# Patient Record
Sex: Female | Born: 1965 | Race: White | Hispanic: No | Marital: Single | Smoking: Current every day smoker
Health system: Southern US, Community
[De-identification: ages and names within clinical notes are randomized; demographics above are authoritative.]

## PROBLEM LIST (undated history)

## (undated) DIAGNOSIS — I10 Essential (primary) hypertension: Secondary | ICD-10-CM

## (undated) DIAGNOSIS — K219 Gastro-esophageal reflux disease without esophagitis: Secondary | ICD-10-CM

## (undated) DIAGNOSIS — B1920 Unspecified viral hepatitis C without hepatic coma: Secondary | ICD-10-CM

## (undated) HISTORY — DX: Gastro-esophageal reflux disease without esophagitis: K21.9

---

## 2004-07-01 ENCOUNTER — Emergency Department: Payer: Self-pay | Admitting: Emergency Medicine

## 2010-05-28 ENCOUNTER — Emergency Department: Payer: Self-pay

## 2011-09-07 ENCOUNTER — Ambulatory Visit: Payer: Self-pay | Admitting: Family Medicine

## 2011-09-29 ENCOUNTER — Ambulatory Visit: Payer: Self-pay | Admitting: Family Medicine

## 2012-04-05 ENCOUNTER — Ambulatory Visit: Payer: Self-pay | Admitting: Family Medicine

## 2014-05-30 DIAGNOSIS — R079 Chest pain, unspecified: Secondary | ICD-10-CM | POA: Insufficient documentation

## 2014-05-30 LAB — LIPID PANEL
CHOLESTEROL: 205 mg/dL — AB (ref 0–200)
HDL: 43 mg/dL (ref 35–70)
LDL CALC: 127 mg/dL
TRIGLYCERIDES: 174 mg/dL — AB (ref 40–160)

## 2014-05-30 LAB — BASIC METABOLIC PANEL
BUN: 10 mg/dL (ref 4–21)
Creatinine: 0.7 mg/dL (ref 0.5–1.1)
Potassium: 5 mmol/L (ref 3.4–5.3)
Sodium: 137 mmol/L (ref 137–147)

## 2014-05-30 LAB — CBC AND DIFFERENTIAL
HEMATOCRIT: 40 % (ref 36–46)
HEMOGLOBIN: 11.9 g/dL — AB (ref 12.0–16.0)
NEUTROS ABS: 5 /uL
PLATELETS: 298 10*3/uL (ref 150–399)
WBC: 8.7 10^3/mL

## 2014-05-30 LAB — HEPATIC FUNCTION PANEL
ALK PHOS: 67 U/L (ref 25–125)
ALT: 21 U/L (ref 7–35)
AST: 22 U/L (ref 13–35)
Bilirubin, Total: 0.2 mg/dL

## 2014-05-30 LAB — TSH: TSH: 1.76 u[IU]/mL (ref 0.41–5.90)

## 2014-06-05 ENCOUNTER — Ambulatory Visit
Admit: 2014-06-05 | Disposition: A | Discharge status: Home / Self Care | Payer: Self-pay | Attending: Internal Medicine | Admitting: Internal Medicine

## 2015-08-25 DIAGNOSIS — R0789 Other chest pain: Secondary | ICD-10-CM

## 2015-09-24 ENCOUNTER — Other Ambulatory Visit: Payer: Self-pay | Admitting: Family Medicine

## 2015-09-24 DIAGNOSIS — Z1239 Encounter for other screening for malignant neoplasm of breast: Secondary | ICD-10-CM

## 2015-10-12 ENCOUNTER — Encounter: Payer: Self-pay | Admitting: Emergency Medicine

## 2015-10-12 ENCOUNTER — Emergency Department
Admission: EM | Admit: 2015-10-12 | Discharge: 2015-10-12 | Disposition: A | Discharge status: Home / Self Care | Payer: BLUE CROSS/BLUE SHIELD | Attending: Emergency Medicine | Admitting: Emergency Medicine

## 2015-10-12 DIAGNOSIS — J0100 Acute maxillary sinusitis, unspecified: Secondary | ICD-10-CM | POA: Diagnosis not present

## 2015-10-12 DIAGNOSIS — R0981 Nasal congestion: Secondary | ICD-10-CM | POA: Diagnosis present

## 2015-10-12 DIAGNOSIS — K0889 Other specified disorders of teeth and supporting structures: Secondary | ICD-10-CM | POA: Diagnosis not present

## 2015-10-12 DIAGNOSIS — F17210 Nicotine dependence, cigarettes, uncomplicated: Secondary | ICD-10-CM | POA: Diagnosis not present

## 2015-10-12 MED ORDER — AMOXICILLIN 500 MG PO CAPS: 500.0000 mg | ORAL_CAPSULE | Freq: Three times a day (TID) | ORAL | 0 refills | Status: DC

## 2015-10-12 MED ORDER — NAPROXEN 500 MG PO TABS
500.0000 mg | ORAL_TABLET | Freq: Once | ORAL | Status: AC
  Administered 2015-10-12: 500 mg via ORAL
  Filled 2015-10-12: qty 1

## 2015-10-12 MED ORDER — FEXOFENADINE-PSEUDOEPHED ER 60-120 MG PO TB12: 1.0000 | ORAL_TABLET | Freq: Two times a day (BID) | ORAL | 0 refills | Status: AC

## 2015-10-12 MED ORDER — TRAMADOL HCL 50 MG PO TABS: 50.0000 mg | ORAL_TABLET | Freq: Four times a day (QID) | ORAL | 0 refills | Status: AC | PRN

## 2015-10-12 MED ORDER — LIDOCAINE VISCOUS 2 % MT SOLN
15.0000 mL | Freq: Once | OROMUCOSAL | Status: AC
  Administered 2015-10-12: 15 mL via OROMUCOSAL
  Filled 2015-10-12: qty 15

## 2015-10-12 MED ORDER — TRAMADOL HCL 50 MG PO TABS
50.0000 mg | ORAL_TABLET | Freq: Once | ORAL | Status: AC
  Administered 2015-10-12: 50 mg via ORAL
  Filled 2015-10-12: qty 1

## 2015-10-12 NOTE — Discharge Instructions (Signed)
Follow-up or firmness of dental clinics provided. OPTIONS FOR DENTAL FOLLOW UP CARE  [place] [facility] and [facility] [email or web address] [phone])  [facility] [phone])  [place] [phone])  [place] [phone])  [facility] [phone]) This clinic caters to the indigent population and is on a lottery system. Location: [employer] [facility], [place], [place], [place] [facility] Hours: Wednesdays from 6pm - 9pm, patients seen by a lottery system. For dates, call or go to [email or web address] Services: Cleanings, fillings and simple extractions. Payment Options: DENTAL WORK IS FREE OF CHARGE. Bring proof of income or support. Best way to get seen: Arrive at 5:15 pm - this is a lottery, NOT first come/first serve, so arriving earlier will not increase your chances of being seen.     [facility] [phone] Select option 1 for emergencies   Location: [facility], [place], [address] [place] Clinic Hours: No walk-ins accepted - call the day before to schedule an appointment. Check in times are 9:30 am and 1:30 pm. Services: Simple extractions, temporary fillings, pulpectomy/pulp debridement, uncomplicated abscess drainage. Payment Options: PAYMENT IS DUE AT THE TIME OF SERVICE.  Fee is usually $100-200, additional surgical procedures (e.g. abscess drainage) may be extra. Cash, checks, Visa/MasterCard accepted.  Can file Medicaid if patient is covered for dental - patient should call case worker to check. No discount for [facility] patients. Best way to get seen: MUST call the day before and get onto the schedule. Can usually be seen the next 1-2 days. No walk-ins accepted.      [place] [phone]   Location: [place], [place] [facility] Hours: M, W, Th, F 8am or 1:30pm, Tues 9a or 1:30 - first come/first served. Services: Simple extractions, temporary fillings, uncomplicated abscess drainage.  You do not need to be an [facility] resident. Payment Options: PAYMENT IS DUE AT THE TIME OF SERVICE. Dental insurance, otherwise sliding scale - bring proof of income or support. Depending on income and treatment needed, cost is usually $50-200. Best way to get seen: Arrive early as it is first come/first served.     [facility] [phone]   Location: [place] [facility] Hours: Mon-Thu 8a-5p Services: Most basic dental services including extractions and fillings. Payment Options: PAYMENT IS DUE AT THE TIME OF SERVICE. Sliding scale, up to 50% off - bring proof if income or support. Medicaid with dental option accepted. Best way to get seen: Call to schedule an appointment, can usually be seen within 2 weeks OR they will try to see walk-ins - show up at [place] or 2p (you may have to wait).     [facility] [place] RESIDENTS ONLY   Location: [facility], [place] [address] [place], [place] [address] Clinic Hours: By appointment only. Monday - Thursday 8am-5pm, Friday 8am-12pm Services: Cleanings, fillings, extractions. Payment Options: PAYMENT IS DUE AT THE TIME OF SERVICE. Cash, Visa or MasterCard. Sliding scale - $30 minimum per service. Best way to get seen: Come in to office, complete packet and make an appointment - need proof of income or support monies for each household member and proof of [facility] residence. Usually takes about a month to get in.     [facility]  [phone] °  °Location: °[address] [place] °Clinic Hours: Walk-in Urgent Care  Dental Services are offered Monday-Friday mornings only. °The numbers of emergencies accepted daily is limited to the number of °providers available. °Maximum 15 - Mondays, Wednesdays & Thursdays °Maximum 10 - Tuesdays & Fridays °Services: °You do not need to be a [place] resident to be seen for a dental emergency. °Emergencies are defined as pain, swelling, abnormal bleeding, or dental trauma. Walkins will receive x-rays if needed. °NOTE: Dental cleaning is not an emergency. °Payment Options: °PAYMENT IS DUE AT THE TIME OF SERVICE. °Minimum co-pay is $40.00 for uninsured patients. °Minimum co-pay is $3.00 for Medicaid with dental coverage. °Dental Insurance is accepted and must be presented at time of visit. °Medicare does not cover dental. °Forms of payment: Cash, credit card, checks. °Best way to get seen: °If not previously registered with the clinic, walk-in dental registration begins at 7:15 am and is on a first come/first serve basis. °If previously registered with the clinic, call to make an appointment. °  °  °[facility] °[phone] °[place] RESIDENTS ONLY °  °Location: °[address], [place], [place] °Clinic Hours: °Mon-Thu 10a-2p °Services: Extractions only! °Payment Options: °FREE (donations accepted) - bring proof of income or support °Best way to get seen: °Call and schedule an appointment OR come at 8am on the 1st Monday of every month (except for holidays) when it is first come/first served. °  °  °[facility] °[phone] °  °Location: °[address], [place] °Clinic Hours: °Friday mornings °Services, Payment Options, Best way to get seen: °Call for info ° °

## 2015-10-12 NOTE — ED Provider Notes (Signed)
[facility] Emergency Department Provider Note   ____________________________________________   None    (approximate)  I have reviewed the triage vital signs and the nursing notes.   HISTORY  Chief Complaint Nasal Congestion and Dental Pain    HPI Jasmine Roach is a 50 y.o. female patient complaining of nasal congestion and cough for 3 months. Patient states complaining is increases she came be running down at night. Patient also complaining of dental pain which started this morning. Patient is rating her dental pain is 8/10. No palliative measures taken for this complaint. Patient rates her congested as a 4/10. Patient does not relieve over-the-counter medications. Patient denies any fever chills or complaints. Patient states she had to decrease her cigarette smoking secondary to the congestion.   Past Medical History:  Diagnosis Date  . GERD (gastroesophageal reflux disease)     Patient Active Problem List   Diagnosis Date Noted  . Chest pain 05/30/2014    Past Surgical History:  Procedure Laterality Date  . CESAREAN SECTION      Prior to Admission medications   Medication Sig Start Date End Date Taking? Authorizing Provider  amoxicillin (AMOXIL) 500 MG capsule Take 1 capsule (500 mg total) by mouth 3 (three) times daily. 10/12/15   Sable Feil, PA-C  fexofenadine-pseudoephedrine (ALLEGRA-D) 60-120 MG 12 hr tablet Take 1 tablet by mouth 2 (two) times daily. 10/12/15   Sable Feil, PA-C  traMADol (ULTRAM) 50 MG tablet Take 1 tablet (50 mg total) by mouth every 6 (six) hours as needed. 10/12/15 10/11/16  Sable Feil, PA-C    Allergies Review of patient's allergies indicates no known allergies.  Family History  Problem Relation Age of Onset  . Broken bones Mother     Hip  . Cancer Father     Colon  . Hypertension Father   . Hyperlipidemia Father   . Thyroid disease Brother   . Thyroid disease Son     Social History Social  History  Substance Use Topics  . Smoking status: Current Every Day Smoker    Packs/day: 1.00    Types: Cigarettes  . Smokeless tobacco: Never Used  . Alcohol use 0.6 oz/week    1 Cans of beer per week     Comment: One beer once a week.     Review of Systems Constitutional: No fever/chills Eyes: No visual changes. ENT: No sore throat. Cardiovascular: Denies chest pain. Respiratory: Denies shortness of breath. Gastrointestinal: No abdominal pain.  No nausea, no vomiting.  No diarrhea.  No constipation. Genitourinary: Negative for dysuria. Musculoskeletal: Negative for back pain. Skin: Negative for rash. Neurological: Negative for headaches, focal weakness or numbness. Endocrine:Hypertension, hyperlipidemia and hypothyroidism. Patient patient was noncompliance of medication for these conditions. ____________________________________________   PHYSICAL EXAM:  VITAL SIGNS: ED Triage Vitals  Enc Vitals Group     BP 10/12/15 1710 139/86     Pulse Rate 10/12/15 1710 98     Resp 10/12/15 1710 20     Temp 10/12/15 1710 98.5 F (36.9 C)     Temp Source 10/12/15 1710 Oral     SpO2 10/12/15 1710 98 %     Weight 10/12/15 1710 163 lb (73.9 kg)     Height 10/12/15 1710 5\' 1"  (1.549 m)     Head Circumference --      Peak Flow --      Pain Score 10/12/15 1716 8     Pain Loc --  Pain Edu? --      Excl. in [place]? --     Constitutional: Alert and oriented. Well appearing and in no acute distress. Eyes: Conjunctivae are normal. PERRL. EOMI. Head: Atraumatic. Nose: Bilateral maxillary guarding edematous nasal turbinates. Mouth/Throat: Mucous membranes are moist.  Oropharynx non-erythematous.Fractured tooth #14 with edematous gingiva. Postnasal drainage. Neck: No stridor.  No cervical spine tenderness to palpation. Hematological/Lymphatic/Immunilogical: No cervical lymphadenopathy. Cardiovascular: Normal rate, regular rhythm. Grossly normal heart sounds.  Good peripheral  circulation. Respiratory: Normal respiratory effort.  No retractions. Lungs CTAB. Gastrointestinal: Soft and nontender. No distention. No abdominal bruits. No CVA tenderness. Musculoskeletal: No lower extremity tenderness nor edema.  No joint effusions. Neurologic:  Normal speech and language. No gross focal neurologic deficits are appreciated. No gait instability. Skin:  Skin is warm, dry and intact. No rash noted. Psychiatric: Mood and affect are normal. Speech and behavior are normal.  ____________________________________________   LABS (all labs ordered are listed, but only abnormal results are displayed)  Labs Reviewed - No data to display ____________________________________________  EKG   ____________________________________________  RADIOLOGY   ____________________________________________   PROCEDURES  Procedure(s) performed: None  Procedures  Critical Care performed: No  ____________________________________________   INITIAL IMPRESSION / ASSESSMENT AND PLAN / ED COURSE  Pertinent labs & imaging results that were available during my care of the patient were reviewed by me and considered in my medical decision making (see chart for details).  Sinusitis and dental pain. Patient given discharge Instructions. Patient advised to follow the walk-in dental clinic in 2 days. Patient [facility]. Patient given a prescription for amoxicillin, tramadol, and ibuprofen.  Clinical Course     ____________________________________________   FINAL CLINICAL IMPRESSION(S) / ED DIAGNOSES  Final diagnoses:  Acute maxillary sinusitis, recurrence not specified  Pain, dental      NEW MEDICATIONS STARTED DURING THIS VISIT:  New Prescriptions   AMOXICILLIN (AMOXIL) 500 MG CAPSULE    Take 1 capsule (500 mg total) by mouth 3 (three) times daily.   FEXOFENADINE-PSEUDOEPHEDRINE (ALLEGRA-D) 60-120 MG 12 HR TABLET    Take 1  tablet by mouth 2 (two) times daily.   TRAMADOL (ULTRAM) 50 MG TABLET    Take 1 tablet (50 mg total) by mouth every 6 (six) hours as needed.     Note:  This document was prepared using Dragon voice recognition software and may include unintentional dictation errors.    Sable Feil, PA-C 10/12/15 1750    Delman Kitten, MD 10/12/15 2337

## 2015-10-12 NOTE — ED Triage Notes (Signed)
Pt arrived to ED with complaints of congestion that has been ongoing for 3 mos. Pt states she has congestion and "can't breath". Pt also states she has had a nonproductive cough and woke this morning with dental pain on the right upper side.

## 2015-10-14 ENCOUNTER — Ambulatory Visit: Payer: Self-pay

## 2015-11-06 ENCOUNTER — Other Ambulatory Visit: Payer: Self-pay | Admitting: Family Medicine

## 2015-11-06 ENCOUNTER — Ambulatory Visit
Admission: RE | Admit: 2015-11-06 | Discharge: 2015-11-06 | Disposition: A | Discharge status: Home / Self Care | Payer: BLUE CROSS/BLUE SHIELD | Source: Ambulatory Visit | Attending: Family Medicine | Admitting: Family Medicine

## 2015-11-06 DIAGNOSIS — Z1231 Encounter for screening mammogram for malignant neoplasm of breast: Secondary | ICD-10-CM | POA: Diagnosis not present

## 2015-11-06 DIAGNOSIS — Z1239 Encounter for other screening for malignant neoplasm of breast: Secondary | ICD-10-CM | POA: Insufficient documentation

## 2017-01-12 ENCOUNTER — Emergency Department: Payer: No Typology Code available for payment source

## 2017-01-12 ENCOUNTER — Encounter: Payer: Self-pay | Admitting: Emergency Medicine

## 2017-01-12 ENCOUNTER — Emergency Department
Admission: EM | Admit: 2017-01-12 | Discharge: 2017-01-12 | Disposition: A | Discharge status: Home / Self Care | Payer: No Typology Code available for payment source | Attending: Emergency Medicine | Admitting: Emergency Medicine

## 2017-01-12 DIAGNOSIS — R0789 Other chest pain: Secondary | ICD-10-CM | POA: Insufficient documentation

## 2017-01-12 DIAGNOSIS — Z760 Encounter for issue of repeat prescription: Secondary | ICD-10-CM | POA: Insufficient documentation

## 2017-01-12 DIAGNOSIS — I10 Essential (primary) hypertension: Secondary | ICD-10-CM | POA: Diagnosis not present

## 2017-01-12 DIAGNOSIS — R079 Chest pain, unspecified: Secondary | ICD-10-CM | POA: Diagnosis present

## 2017-01-12 DIAGNOSIS — F17210 Nicotine dependence, cigarettes, uncomplicated: Secondary | ICD-10-CM | POA: Insufficient documentation

## 2017-01-12 HISTORY — DX: Essential (primary) hypertension: I10

## 2017-01-12 LAB — CBC
HCT: 39 % (ref 35.0–47.0)
Hemoglobin: 12.5 g/dL (ref 12.0–16.0)
MCH: 25.5 pg — ABNORMAL LOW (ref 26.0–34.0)
MCHC: 32 g/dL (ref 32.0–36.0)
MCV: 79.8 fL — AB (ref 80.0–100.0)
PLATELETS: 248 10*3/uL (ref 150–440)
RBC: 4.89 MIL/uL (ref 3.80–5.20)
RDW: 16.4 % — AB (ref 11.5–14.5)
WBC: 7.2 10*3/uL (ref 3.6–11.0)

## 2017-01-12 LAB — BASIC METABOLIC PANEL
Anion gap: 8 (ref 5–15)
BUN: 8 mg/dL (ref 6–20)
CALCIUM: 8.9 mg/dL (ref 8.9–10.3)
CO2: 26 mmol/L (ref 22–32)
CREATININE: 0.67 mg/dL (ref 0.44–1.00)
Chloride: 104 mmol/L (ref 101–111)
GFR calc Af Amer: >60 mL/min (ref >60)
GLUCOSE: 128 mg/dL — AB (ref 65–99)
Potassium: 3.9 mmol/L (ref 3.5–5.1)
Sodium: 138 mmol/L (ref 135–145)

## 2017-01-12 LAB — TROPONIN I: Troponin I: <0.03 ng/mL (ref <0.03)

## 2017-01-12 MED ORDER — LISINOPRIL 10 MG PO TABS: 10.0000 mg | ORAL_TABLET | Freq: Every day | ORAL | 0 refills | Status: AC

## 2017-01-12 MED ORDER — ATORVASTATIN CALCIUM 40 MG PO TABS: 40.0000 mg | ORAL_TABLET | Freq: Every day | ORAL | 11 refills | Status: AC

## 2017-01-12 NOTE — ED Triage Notes (Signed)
PT to ED via POV with c/o CP radiating to LFT arm at work this am, was seen by paramedics and told to come to rule out MI. PT A&Ox4, NAD noted. VS stable

## 2017-01-12 NOTE — ED Notes (Signed)
Gave patient ice chips.

## 2017-01-12 NOTE — ED Provider Notes (Signed)
[facility] Emergency Department Provider Note  ____________________________________________  Time seen: Approximately 11:38 AM  I have reviewed the triage vital signs and the nursing notes.   HISTORY  Chief Complaint Chest Pain    HPI Jasmine Roach is a 51 y.o. female comes to the ED complaining of intermittent chest pain going on for the last 3 days, worse with moving her arm up above her head. No alleviating factors. Last for a few minutes at a time. Nonradiating. No shortness of breath vomiting or diaphoresis due to the pain. Nonradiating. Feels sharp. Onset after moving a refrigerator couple of days ago.     Past Medical History:  Diagnosis Date  . GERD (gastroesophageal reflux disease)   . Hypertension      Patient Active Problem List   Diagnosis Date Noted  . Chest pain 05/30/2014     Past Surgical History:  Procedure Laterality Date  . CESAREAN SECTION       Prior to Admission medications   Medication Sig Start Date End Date Taking? Authorizing Provider  amoxicillin (AMOXIL) 500 MG capsule Take 1 capsule (500 mg total) by mouth 3 (three) times daily. 10/12/15   Sable Feil, PA-C  atorvastatin (LIPITOR) 40 MG tablet Take 1 tablet (40 mg total) by mouth daily. 01/12/17 01/12/18  Carrie Mew, MD  fexofenadine-pseudoephedrine (ALLEGRA-D) 60-120 MG 12 hr tablet Take 1 tablet by mouth 2 (two) times daily. 10/12/15   Sable Feil, PA-C  lisinopril (PRINIVIL,ZESTRIL) 10 MG tablet Take 1 tablet (10 mg total) by mouth daily. 01/12/17 01/12/18  Carrie Mew, MD     Allergies Patient has no known allergies.   Family History  Problem Relation Age of Onset  . Broken bones Mother        Hip  . Cancer Father        Colon  . Hypertension Father   . Hyperlipidemia Father   . Thyroid disease Brother   . Thyroid disease Son   . Breast cancer Other     Social History Social History   Tobacco Use  . Smoking status: Current  Every Day Smoker    Packs/day: 1.00    Types: Cigarettes  . Smokeless tobacco: Never Used  Substance Use Topics  . Alcohol use: Yes    Alcohol/week: 0.6 oz    Types: 1 Cans of beer per week    Comment: One beer once a week.   . Drug use: No    Review of Systems  Constitutional:   No fever or chills.  ENT:   No sore throat. No rhinorrhea. Cardiovascular:   Positive as above chest pain without syncope. Respiratory:   No dyspnea or cough. Gastrointestinal:   Negative for abdominal pain, vomiting and diarrhea.  Musculoskeletal:   Positive left shoulder pain All other systems reviewed and are negative except as documented above in ROS and HPI.  ____________________________________________   PHYSICAL EXAM:  VITAL SIGNS: ED Triage Vitals  Enc Vitals Group     BP 01/12/17 0911 (!) 149/82     Pulse Rate 01/12/17 0911 74     Resp 01/12/17 0911 16     Temp 01/12/17 0911 97.7 F (36.5 C)     Temp Source 01/12/17 0911 Oral     SpO2 01/12/17 0911 100 %     Weight 01/12/17 0912 160 lb (72.6 kg)     Height 01/12/17 0912 5\' 1"  (1.549 m)     Head Circumference --  Peak Flow --      Pain Score 01/12/17 0911 5     Pain Loc --      Pain Edu? --      Excl. in [place]? --     Vital signs reviewed, nursing assessments reviewed.   Constitutional:   Alert and oriented. Well appearing and in no distress. Eyes:   No scleral icterus.  EOMI. No nystagmus. No conjunctival pallor. PERRL. ENT   Head:   Normocephalic and atraumatic.   Nose:   No congestion/rhinnorhea.    Mouth/Throat:   MMM, no pharyngeal erythema. No peritonsillar mass.    Neck:   No meningismus. Full ROM. Hematological/Lymphatic/Immunilogical:   No cervical lymphadenopathy. Cardiovascular:   RRR. Symmetric bilateral radial and DP pulses.  No murmurs.  Respiratory:   Normal respiratory effort without tachypnea/retractions. Breath sounds are clear and equal bilaterally. No wheezes/rales/rhonchi. Gastrointestinal:    Soft and nontender. Non distended. There is no CVA tenderness.  No rebound, rigidity, or guarding. Genitourinary:   deferred Musculoskeletal:   Normal range of motion in all extremities. No joint effusions.  No lower extremity tenderness.  No edema. Chest wall tender to the touch in the left anterior upper chest. Pain is reproduced by this. Also reproduced by having the patient adduct and internally rotate the left arm against resistance. Neurologic:   Normal speech and language.  Motor grossly intact. No gross focal neurologic deficits are appreciated.  Skin:    Skin is warm, dry and intact. No rash noted.  No petechiae, purpura, or bullae.  ____________________________________________    LABS (pertinent positives/negatives) (all labs ordered are listed, but only abnormal results are displayed) Labs Reviewed  BASIC METABOLIC PANEL - Abnormal; Notable for the following components:      Result Value   Glucose, Bld 128 (*)    All other components within normal limits  CBC - Abnormal; Notable for the following components:   MCV 79.8 (*)    MCH 25.5 (*)    RDW 16.4 (*)    All other components within normal limits  TROPONIN I  POC URINE PREG, ED   ____________________________________________   EKG  Interpreted by me  Date: 01/12/2017  Rate: 79  Rhythm: normal sinus rhythm  QRS Axis: normal  Intervals: normal  ST/T Wave abnormalities: normal  Conduction Disutrbances: none  Narrative Interpretation: unremarkable      ____________________________________________    RADIOLOGY  Dg Chest 2 View  Result Date: 01/12/2017 CLINICAL DATA:  Shortness of breath and chest pain. Smoking history. EXAM: CHEST  2 VIEW COMPARISON:  05/28/2010 FINDINGS: Heart size is normal. Mediastinal shadows are normal. The patient has taken a poor inspiration. Allowing for that, the lungs are felt to be clear. No infiltrate, collapse or effusion. No significant bone finding. IMPRESSION: Poor  inspiration.  No active disease suspected. Electronically Signed   By: Nelson Chimes M.D.   On: 01/12/2017 09:50    ____________________________________________   PROCEDURES Procedures  ____________________________________________     CLINICAL IMPRESSION / ASSESSMENT AND PLAN / ED COURSE  Pertinent labs & imaging results that were available during my care of the patient were reviewed by me and considered in my medical decision making (see chart for details).   Patient well-appearing no acute distress, presents with musculoskeletal chest wall pain likely muscular strain.Considering the patient's symptoms, medical history, and physical examination today, I have low suspicion for ACS, PE, TAD, pneumothorax, carditis, mediastinitis, pneumonia, CHF, or sepsis.  Heart score is low risk. Vital  signs are normal, EKG is normal, troponin is negative. Discharge home. Patient also requests a refill of her atorvastatin and lisinopril which I have provided for her. Provided resources for primary care clinics in the area as well.      ____________________________________________   FINAL CLINICAL IMPRESSION(S) / ED DIAGNOSES    Final diagnoses:  Chest wall pain  Medication refill      This SmartLink is deprecated. Use AVSMEDLIST instead to display the medication list for a patient.   Portions of this note were generated with dragon dictation software. Dictation errors may occur despite best attempts at proofreading.    Carrie Mew, MD 01/12/17 [phone]

## 2018-01-16 ENCOUNTER — Ambulatory Visit: Payer: No Typology Code available for payment source

## 2018-03-09 ENCOUNTER — Other Ambulatory Visit: Payer: Self-pay | Admitting: Family Medicine

## 2018-03-09 ENCOUNTER — Other Ambulatory Visit: Payer: Self-pay | Admitting: Psychology

## 2018-03-09 ENCOUNTER — Other Ambulatory Visit (HOSPITAL_COMMUNITY): Payer: Self-pay | Admitting: Family Medicine

## 2018-03-09 DIAGNOSIS — N950 Postmenopausal bleeding: Secondary | ICD-10-CM

## 2018-03-09 DIAGNOSIS — B1920 Unspecified viral hepatitis C without hepatic coma: Secondary | ICD-10-CM

## 2018-03-14 ENCOUNTER — Ambulatory Visit
Admission: RE | Admit: 2018-03-14 | Discharge: 2018-03-14 | Disposition: A | Discharge status: Home / Self Care | Payer: BLUE CROSS/BLUE SHIELD | Source: Ambulatory Visit | Attending: Family Medicine | Admitting: Family Medicine

## 2018-03-14 DIAGNOSIS — N950 Postmenopausal bleeding: Secondary | ICD-10-CM | POA: Insufficient documentation

## 2018-03-14 DIAGNOSIS — B1920 Unspecified viral hepatitis C without hepatic coma: Secondary | ICD-10-CM

## 2018-04-02 ENCOUNTER — Emergency Department
Admission: EM | Admit: 2018-04-02 | Discharge: 2018-04-02 | Disposition: A | Discharge status: Home / Self Care | Payer: BLUE CROSS/BLUE SHIELD | Attending: Emergency Medicine | Admitting: Emergency Medicine

## 2018-04-02 ENCOUNTER — Other Ambulatory Visit: Payer: Self-pay

## 2018-04-02 ENCOUNTER — Emergency Department: Payer: BLUE CROSS/BLUE SHIELD

## 2018-04-02 DIAGNOSIS — Z79899 Other long term (current) drug therapy: Secondary | ICD-10-CM | POA: Diagnosis not present

## 2018-04-02 DIAGNOSIS — R1011 Right upper quadrant pain: Secondary | ICD-10-CM | POA: Diagnosis present

## 2018-04-02 DIAGNOSIS — I10 Essential (primary) hypertension: Secondary | ICD-10-CM | POA: Insufficient documentation

## 2018-04-02 DIAGNOSIS — N390 Urinary tract infection, site not specified: Secondary | ICD-10-CM

## 2018-04-02 DIAGNOSIS — F17210 Nicotine dependence, cigarettes, uncomplicated: Secondary | ICD-10-CM | POA: Diagnosis not present

## 2018-04-02 HISTORY — DX: Unspecified viral hepatitis C without hepatic coma: B19.20

## 2018-04-02 LAB — COMPREHENSIVE METABOLIC PANEL
ALBUMIN: 3.5 g/dL (ref 3.5–5.0)
ALK PHOS: 77 U/L (ref 38–126)
ALT: 11 U/L (ref 0–44)
ANION GAP: 8 (ref 5–15)
AST: 21 U/L (ref 15–41)
BILIRUBIN TOTAL: 0.4 mg/dL (ref 0.3–1.2)
BUN: 9 mg/dL (ref 6–20)
CO2: 25 mmol/L (ref 22–32)
Calcium: 8.6 mg/dL — ABNORMAL LOW (ref 8.9–10.3)
Chloride: 105 mmol/L (ref 98–111)
Creatinine, Ser: 0.69 mg/dL (ref 0.44–1.00)
Glucose, Bld: 163 mg/dL — ABNORMAL HIGH (ref 70–99)
POTASSIUM: 3.7 mmol/L (ref 3.5–5.1)
Sodium: 138 mmol/L (ref 135–145)
Total Protein: 7.5 g/dL (ref 6.5–8.1)

## 2018-04-02 LAB — CBC
HCT: 39.9 % (ref 36.0–46.0)
HEMOGLOBIN: 12.6 g/dL (ref 12.0–15.0)
MCH: 26.4 pg (ref 26.0–34.0)
MCHC: 31.6 g/dL (ref 30.0–36.0)
MCV: 83.5 fL (ref 80.0–100.0)
NRBC: 0 % (ref 0.0–0.2)
Platelets: 315 10*3/uL (ref 150–400)
RBC: 4.78 MIL/uL (ref 3.87–5.11)
RDW: 13.3 % (ref 11.5–15.5)
WBC: 8.3 10*3/uL (ref 4.0–10.5)

## 2018-04-02 LAB — URINALYSIS, COMPLETE (UACMP) WITH MICROSCOPIC
BILIRUBIN URINE: NEGATIVE
Bacteria, UA: NONE SEEN
GLUCOSE, UA: NEGATIVE mg/dL
Ketones, ur: NEGATIVE mg/dL
Nitrite: NEGATIVE
PH: 5 (ref 5.0–8.0)
Protein, ur: 30 mg/dL — AB
Specific Gravity, Urine: 1.014 (ref 1.005–1.030)

## 2018-04-02 LAB — LIPASE, BLOOD: Lipase: 60 U/L — ABNORMAL HIGH (ref 11–51)

## 2018-04-02 MED ORDER — IOHEXOL 300 MG/ML  SOLN
100.0000 mL | Freq: Once | INTRAMUSCULAR | Status: AC | PRN
  Administered 2018-04-02: 100 mL via INTRAVENOUS

## 2018-04-02 MED ORDER — SODIUM CHLORIDE 0.9 % IV SOLN
1.0000 g | Freq: Once | INTRAVENOUS | Status: AC
  Administered 2018-04-02: 1 g via INTRAVENOUS
  Filled 2018-04-02: qty 10

## 2018-04-02 MED ORDER — IOPAMIDOL (ISOVUE-300) INJECTION 61%
30.0000 mL | Freq: Once | INTRAVENOUS | Status: AC
  Administered 2018-04-02: 30 mL via ORAL

## 2018-04-02 MED ORDER — AMOXICILLIN-POT CLAVULANATE 875-125 MG PO TABS: 1.0000 | ORAL_TABLET | Freq: Two times a day (BID) | ORAL | 0 refills | Status: AC

## 2018-04-02 MED ORDER — HYDROCODONE-ACETAMINOPHEN 5-325 MG PO TABS: 1.0000 | ORAL_TABLET | Freq: Four times a day (QID) | ORAL | 0 refills | Status: AC | PRN

## 2018-04-02 MED ORDER — ONDANSETRON HCL 4 MG/2ML IJ SOLN
4.0000 mg | Freq: Once | INTRAMUSCULAR | Status: AC
  Administered 2018-04-02: 4 mg via INTRAVENOUS
  Filled 2018-04-02: qty 2

## 2018-04-02 NOTE — ED Triage Notes (Addendum)
Pt points to R flank area. Pt points to RUQ. Family member states RLQ has been hurting pt. Denies BM recently. Symptoms x 3 days. Still has abd organs. Also c/o dysuria. Pt states still in menopause. Family member attempting to talk over pt when pt is able to answer questions herself. States seen last week and had pelvic US and [place] of liver.

## 2018-04-02 NOTE — ED Provider Notes (Signed)
[facility] Emergency Department Provider Note   ____________________________________________   First MD Initiated Contact with Patient 04/02/18 1641     (approximate)  I have reviewed the triage vital signs and the nursing notes.   HISTORY  Chief Complaint Abdominal Pain    HPI Jasmine Roach is a 53 y.o. female reports of a history of hepatitis C that was recently treated and "cured" about 2 weeks ago.  Still having about 3 to 4 days of pain in her right flank area.  She reports is fairly persistent.  Slightly worse with urination.  No nausea.  No vomiting.  No fevers or chills.  She reports this pain in the right flank some worsening with urination.  Denies severe pain.  Reports mild to moderate.  It is a consistent pain however.  No chest pain.  No trouble breathing.  No ripping or tearing discomfort   Past Medical History:  Diagnosis Date  . GERD (gastroesophageal reflux disease)   . Hepatitis C   . Hypertension     Patient Active Problem List   Diagnosis Date Noted  . Chest pain 05/30/2014    Past Surgical History:  Procedure Laterality Date  . CESAREAN SECTION      Prior to Admission medications   Medication Sig Start Date End Date Taking? Authorizing Provider  amoxicillin (AMOXIL) 500 MG capsule Take 1 capsule (500 mg total) by mouth 3 (three) times daily. 10/12/15   Sable Feil, PA-C  amoxicillin-clavulanate (AUGMENTIN) 875-125 MG tablet Take 1 tablet by mouth 2 (two) times daily for 7 days. 04/02/18 04/09/18  Delman Kitten, MD  atorvastatin (LIPITOR) 40 MG tablet Take 1 tablet (40 mg total) by mouth daily. 01/12/17 01/12/18  Carrie Mew, MD  fexofenadine-pseudoephedrine (ALLEGRA-D) 60-120 MG 12 hr tablet Take 1 tablet by mouth 2 (two) times daily. 10/12/15   Sable Feil, PA-C  HYDROcodone-acetaminophen (NORCO/VICODIN) 5-325 MG tablet Take 1 tablet by mouth every 6 (six) hours as needed for moderate pain. 04/02/18   Delman Kitten, MD    lisinopril (PRINIVIL,ZESTRIL) 10 MG tablet Take 1 tablet (10 mg total) by mouth daily. 01/12/17 01/12/18  Carrie Mew, MD    Allergies Patient has no known allergies.  Family History  Problem Relation Age of Onset  . Broken bones Mother        Hip  . Cancer Father        Colon  . Hypertension Father   . Hyperlipidemia Father   . Thyroid disease Brother   . Thyroid disease Son   . Breast cancer Other     Social History Social History   Tobacco Use  . Smoking status: Current Every Day Smoker    Packs/day: 1.00    Types: Cigarettes  . Smokeless tobacco: Never Used  Substance Use Topics  . Alcohol use: Yes    Alcohol/week: 1.0 standard drinks    Types: 1 Cans of beer per week    Comment: One beer once a week.   . Drug use: No    Review of Systems Constitutional: No fever/chills Eyes: No visual changes. ENT: No sore throat. Cardiovascular: Denies chest pain. Respiratory: Denies shortness of breath. Gastrointestinal: N see HPI Genitourinary: Some discomfort with urination Musculoskeletal: Negative for back pain. Skin: Negative for rash. Neurological: Negative for headaches, areas of focal weakness or numbness.    ____________________________________________   PHYSICAL EXAM:  VITAL SIGNS: ED Triage Vitals  Enc Vitals Group     BP 04/02/18 1334 (!)  149/83     Pulse Rate 04/02/18 1334 87     Resp 04/02/18 1334 18     Temp 04/02/18 1334 (!) 97.5 F (36.4 C)     Temp Source 04/02/18 1334 Oral     SpO2 04/02/18 1334 98 %     Weight 04/02/18 1335 178 lb (80.7 kg)     Height 04/02/18 1335 5\' 1"  (1.549 m)     Head Circumference --      Peak Flow --      Pain Score 04/02/18 1334 5     Pain Loc --      Pain Edu? --      Excl. in [place]? --     Constitutional: Alert and oriented. Well appearing and in no acute distress.  She and her family at the bedside very pleasant. Eyes: Conjunctivae are normal. Head: Atraumatic. Nose: No  congestion/rhinnorhea. Mouth/Throat: Mucous membranes are moist. Neck: No stridor.  Cardiovascular: Normal rate, regular rhythm. Grossly normal heart sounds.  Good peripheral circulation. Respiratory: Normal respiratory effort.  No retractions. Lungs CTAB. Gastrointestinal: Soft and nontender except she does report some mild discomfort in the right flank right upper quadrant region without rebound or guarding. No distention.  No CVA tenderness. Musculoskeletal: No lower extremity tenderness nor edema. Neurologic:  Normal speech and language. No gross focal neurologic deficits are appreciated.  Skin:  Skin is warm, dry and intact. No rash noted. Psychiatric: Mood and affect are normal. Speech and behavior are normal.  ____________________________________________   LABS (all labs ordered are listed, but only abnormal results are displayed)  Labs Reviewed  URINE CULTURE - Abnormal; Notable for the following components:      Result Value   Culture >=100,000 COLONIES/mL ESCHERICHIA COLI (*)    Organism ID, Bacteria ESCHERICHIA COLI (*)    All other components within normal limits  LIPASE, BLOOD - Abnormal; Notable for the following components:   Lipase 60 (*)    All other components within normal limits  COMPREHENSIVE METABOLIC PANEL - Abnormal; Notable for the following components:   Glucose, Bld 163 (*)    Calcium 8.6 (*)    All other components within normal limits  URINALYSIS, COMPLETE (UACMP) WITH MICROSCOPIC - Abnormal; Notable for the following components:   Color, Urine YELLOW (*)    APPearance HAZY (*)    Hgb urine dipstick SMALL (*)    Protein, ur 30 (*)    Leukocytes,Ua TRACE (*)    WBC, UA >50 (*)    All other components within normal limits  CBC   ____________________________________________  EKG   ____________________________________________  RADIOLOGY   Reviewed ordered radiography studies  IMPRESSION: CT scan No acute abnormality abdomen or pelvis. No  finding to explain the patient's symptoms.   ____________________________________________   PROCEDURES  Procedure(s) performed: None  Procedures  Critical Care performed: No  ____________________________________________   INITIAL IMPRESSION / ASSESSMENT AND PLAN / ED COURSE  Pertinent labs & imaging results that were available during my care of the patient were reviewed by me and considered in my medical decision making (see chart for details).   Differential diagnosis includes but is not limited to, abdominal perforation, aortic dissection, cholecystitis, appendicitis, diverticulitis, colitis, esophagitis/gastritis, kidney stone, pyelonephritis, urinary tract infection, aortic aneurysm. All are considered in decision and treatment plan. Based upon the patient's presentation and risk factors, I suspect this may be urinary in etiology but she does have some discomfort in the right flank.  Overall her lab work including  comprehensive metabolic panel and CBC are quite reassuring.  Proceed with CT abdomen pelvis to further evaluate given the nature located in the right flank, did not wish to miss appendicitis or cholecystitis though I suspect this is likely urinary source based on urinalysis  Patient CT scan reassuring, she is doing well and in no distress.  Discussed with the patient, does appear consistent with urinary tract infection.  No signs of pyelonephritis at this time.  Will treat with antibiotics and a short course of pain control medication. I will prescribe the patient a narcotic pain medicine due to their condition which I anticipate will cause at least moderate pain short term. I discussed with the patient safe use of narcotic pain medicines, and that they are not to drive, work in dangerous areas, or ever take more than prescribed (no more than 1 pill every 6 hours). We discussed that this is the type of medication that can be  overdosed on and the risks of this type of medicine.  Patient is very agreeable to only use as prescribed and to never use more than prescribed.  The patient is not driving herself.  Return precautions and treatment recommendations and follow-up discussed with the patient who is agreeable with the plan.        ____________________________________________   FINAL CLINICAL IMPRESSION(S) / ED DIAGNOSES  Final diagnoses:  Urinary tract infection, acute        Note:  This document was prepared using Dragon voice recognition software and may include unintentional dictation errors       Delman Kitten, MD 04/08/18 2134

## 2018-04-02 NOTE — ED Notes (Signed)
[facility] sent to lobby by Threasa Beards EDT

## 2018-04-02 NOTE — Discharge Instructions (Addendum)

## 2018-04-02 NOTE — ED Provider Notes (Signed)
[facility] Emergency Department Provider Note  ____________________________________________   First MD Initiated Contact with Patient 04/02/18 1641     (approximate)  I have reviewed the triage vital signs and the nursing notes.  HISTORY  Chief Complaint Abdominal Pain    HPI LEVERNE TESSLER is a 53 y.o. female history of reflux, hypertension and recently treated and she reports cured from hepatitis C about 2 weeks ago   She reports that for about 3 days now she has discomfort in her right flank area.  She is also been noticing a burning feeling with urination.  A little bit of nausea but no vomiting.  No fevers or chills.  No chest pain or trouble breathing  Reports the pain is moderate located mostly in the right flank region not associated any vomiting.  Past Medical History:  Diagnosis Date  . GERD (gastroesophageal reflux disease)   . Hepatitis C   . Hypertension     Patient Active Problem List   Diagnosis Date Noted  . Chest pain 05/30/2014    Past Surgical History:  Procedure Laterality Date  . CESAREAN SECTION      Prior to Admission medications   Medication Sig Start Date End Date Taking? Authorizing Provider  amoxicillin (AMOXIL) 500 MG capsule Take 1 capsule (500 mg total) by mouth 3 (three) times daily. 10/12/15   Sable Feil, PA-C  amoxicillin-clavulanate (AUGMENTIN) 875-125 MG tablet Take 1 tablet by mouth 2 (two) times daily for 7 days. 04/02/18 04/09/18  Delman Kitten, MD  atorvastatin (LIPITOR) 40 MG tablet Take 1 tablet (40 mg total) by mouth daily. 01/12/17 01/12/18  Carrie Mew, MD  fexofenadine-pseudoephedrine (ALLEGRA-D) 60-120 MG 12 hr tablet Take 1 tablet by mouth 2 (two) times daily. 10/12/15   Sable Feil, PA-C  HYDROcodone-acetaminophen (NORCO/VICODIN) 5-325 MG tablet Take 1 tablet by mouth every 6 (six) hours as needed for moderate pain. 04/02/18   Delman Kitten, MD  lisinopril (PRINIVIL,ZESTRIL) 10 MG tablet Take  1 tablet (10 mg total) by mouth daily. 01/12/17 01/12/18  Carrie Mew, MD    Allergies Patient has no known allergies.  Family History  Problem Relation Age of Onset  . Broken bones Mother        Hip  . Cancer Father        Colon  . Hypertension Father   . Hyperlipidemia Father   . Thyroid disease Brother   . Thyroid disease Son   . Breast cancer Other     Social History Social History   Tobacco Use  . Smoking status: Current Every Day Smoker    Packs/day: 1.00    Types: Cigarettes  . Smokeless tobacco: Never Used  Substance Use Topics  . Alcohol use: Yes    Alcohol/week: 1.0 standard drinks    Types: 1 Cans of beer per week    Comment: One beer once a week.   . Drug use: No    Review of Systems Constitutional: No fever/chills Eyes: No visual changes. ENT: No sore throat. Cardiovascular: Denies chest pain. Respiratory: Denies shortness of breath. Gastrointestinal: No diarrhea.  See HPI genitourinary: Negative for dysuria. Musculoskeletal: Negative for back pain. Skin: Negative for rash. Neurological: Negative for headaches, areas of focal weakness or numbness.    ____________________________________________   PHYSICAL EXAM:  VITAL SIGNS: ED Triage Vitals  Enc Vitals Group     BP 04/02/18 1334 (!) 149/83     Pulse Rate 04/02/18 1334 87  Resp 04/02/18 1334 18     Temp 04/02/18 1334 (!) 97.5 F (36.4 C)     Temp Source 04/02/18 1334 Oral     SpO2 04/02/18 1334 98 %     Weight 04/02/18 1335 178 lb (80.7 kg)     Height 04/02/18 1335 5\' 1"  (1.549 m)     Head Circumference --      Peak Flow --      Pain Score 04/02/18 1334 5     Pain Loc --      Pain Edu? --      Excl. in [place]? --     Constitutional: Alert and oriented. Well appearing and in no acute distress. Eyes: Conjunctivae are normal. Head: Atraumatic. Nose: No congestion/rhinnorhea. Mouth/Throat: Mucous membranes are moist. Neck: No stridor.  Cardiovascular: Normal rate, regular  rhythm. Grossly normal heart sounds.  Good peripheral circulation. Respiratory: Normal respiratory effort.  No retractions. Lungs CTAB. Gastrointestinal: Soft and nontender except she does report some mild discomfort to palpation suprapubically, but no reproducible tenderness in the right side right upper abdomen or at McBurney's point. No distention. Musculoskeletal: No lower extremity tenderness nor edema. Neurologic:  Normal speech and language. No gross focal neurologic deficits are appreciated.  Skin:  Skin is warm, dry and intact. No rash noted. Psychiatric: Mood and affect are normal. Speech and behavior are normal.  ____________________________________________   LABS (all labs ordered are listed, but only abnormal results are displayed)  Labs Reviewed  LIPASE, BLOOD - Abnormal; Notable for the following components:      Result Value   Lipase 60 (*)    All other components within normal limits  COMPREHENSIVE METABOLIC PANEL - Abnormal; Notable for the following components:   Glucose, Bld 163 (*)    Calcium 8.6 (*)    All other components within normal limits  URINALYSIS, COMPLETE (UACMP) WITH MICROSCOPIC - Abnormal; Notable for the following components:   Color, Urine YELLOW (*)    APPearance HAZY (*)    Hgb urine dipstick SMALL (*)    Protein, ur 30 (*)    Leukocytes,Ua TRACE (*)    WBC, UA >50 (*)    All other components within normal limits  URINE CULTURE  CBC   ____________________________________________  EKG   ____________________________________________  RADIOLOGY  Ct Abdomen Pelvis W Contrast  Result Date: 04/02/2018 CLINICAL DATA:  Right lower quadrant pain and nausea for 3 days. Burning with urination. EXAM: CT ABDOMEN AND PELVIS WITH CONTRAST TECHNIQUE: Multidetector CT imaging of the abdomen and pelvis was performed using the standard protocol following bolus administration of intravenous contrast. CONTRAST:  115mL OMNIPAQUE IOHEXOL 300 MG/ML  SOLN  COMPARISON:  None. FINDINGS: Lower chest: Lung bases clear. No pleural or pericardial effusion. Heart size is normal. Hepatobiliary: No focal liver abnormality is seen. No gallstones, gallbladder wall thickening, or biliary dilatation. Pancreas: Unremarkable. No pancreatic ductal dilatation or surrounding inflammatory changes. Spleen: Normal in size without focal abnormality. Adrenals/Urinary Tract: Adrenal glands are unremarkable. Kidneys are normal, without renal calculi, focal lesion, or hydronephrosis. Bladder is unremarkable. Stomach/Bowel: Stomach is within normal limits. Appendix appears normal. No evidence of bowel wall thickening, distention, or inflammatory changes. Vascular/Lymphatic: Aortic atherosclerosis. No enlarged abdominal or pelvic lymph nodes. Reproductive: Uterus and bilateral adnexa are unremarkable. Other: None. Musculoskeletal: No acute or focal abnormality. Facet degenerative disease at L4-5 and L5-S1 results in trace anterolisthesis at both levels. IMPRESSION: No acute abnormality abdomen or pelvis. No finding to explain the patient's symptoms. Scattered atherosclerosis.  Lower lumbar spondylosis. Electronically Signed   By: Inge Rise M.D.   On: 04/02/2018 18:05    CT results reviewed, negative for acute findings ____________________________________________   PROCEDURES  Procedure(s) performed: None  Procedures  Critical Care performed: No  ____________________________________________   INITIAL IMPRESSION / ASSESSMENT AND PLAN / ED COURSE  Pertinent labs & imaging results that were available during my care of the patient were reviewed by me and considered in my medical decision making (see chart for details).   Differential diagnosis includes but is not limited to, abdominal perforation, aortic dissection, cholecystitis, appendicitis, diverticulitis, colitis, esophagitis/gastritis, kidney stone, pyelonephritis, urinary tract infection, aortic aneurysm. All are  considered in decision and treatment plan. Based upon the patient's presentation and risk factors, will proceed with a CT given the patient's right-sided abdominal pain.  Wish to exclude cholecystitis, appendicitis, nephrolithiasis.  I will suspect likely urinary tract infection without evidence of complication, but given the patient's history and location of right-sided pain feel to be prudent to exclude other etiologies as well    ----------------------------------------- 8:30 PM on 04/02/2018 -----------------------------------------  Patient discharged to home.  I will prescribe the patient a narcotic pain medicine due to their condition which I anticipate will cause at least moderate pain short term. I discussed with the patient safe use of narcotic pain medicines, and that they are not to drive, work in dangerous areas, or ever take more than prescribed (no more than 1 pill every 6 hours). We discussed that this is the type of medication that can be  overdosed on and the risks of this type of medicine. Patient is very agreeable to only use as prescribed and to never use more than prescribed.  Return precautions and treatment recommendations and follow-up discussed with the patient who is agreeable with the plan.  Appears appropriate for outpatient therapy with Augmentin.  Drug database reviewed        ____________________________________________   FINAL CLINICAL IMPRESSION(S) / ED DIAGNOSES  Final diagnoses:  Urinary tract infection, acute        Note:  This document was prepared using Dragon voice recognition software and may include unintentional dictation errors       Delman Kitten, MD 04/02/18 2127

## 2018-04-05 ENCOUNTER — Ambulatory Visit
Admission: RE | Admit: 2018-04-05 | Discharge: 2018-04-05 | Disposition: A | Discharge status: Home / Self Care | Payer: BLUE CROSS/BLUE SHIELD | Source: Ambulatory Visit | Attending: Family Medicine | Admitting: Family Medicine

## 2018-04-05 ENCOUNTER — Ambulatory Visit: Payer: BLUE CROSS/BLUE SHIELD

## 2018-04-05 ENCOUNTER — Other Ambulatory Visit: Payer: Self-pay | Admitting: *Deleted

## 2018-04-05 DIAGNOSIS — Z0000 Encounter for general adult medical examination without abnormal findings: Secondary | ICD-10-CM

## 2018-04-05 DIAGNOSIS — Z1231 Encounter for screening mammogram for malignant neoplasm of breast: Secondary | ICD-10-CM | POA: Insufficient documentation

## 2018-04-05 LAB — URINE CULTURE: Culture: >=100000 — AB

## 2018-11-01 ENCOUNTER — Encounter: Payer: Self-pay | Admitting: Physician Assistant

## 2018-11-02 ENCOUNTER — Telehealth: Payer: Self-pay

## 2018-11-02 ENCOUNTER — Other Ambulatory Visit: Payer: Self-pay

## 2018-11-02 DIAGNOSIS — Z1211 Encounter for screening for malignant neoplasm of colon: Secondary | ICD-10-CM

## 2018-11-02 DIAGNOSIS — Z800 Family history of malignant neoplasm of digestive organs: Secondary | ICD-10-CM

## 2018-11-02 MED ORDER — GOLYTELY 236 G PO SOLR: 4000.0000 mL | Freq: Once | ORAL | 0 refills | Status: AC

## 2018-11-02 NOTE — Telephone Encounter (Signed)
Gastroenterology Pre-Procedure Review  Request Date: 11/14/18 Requesting Physician: Dr. Allen Norris  PATIENT REVIEW QUESTIONS: The patient responded to the following health history questions as indicated:    1. Are you having any GI issues? no 2. Do you have a personal history of Polyps? no 3. Do you have a family history of Colon Cancer or Polyps? yes (Dad colon cancer) 4. Diabetes Mellitus? no 5. Joint replacements in the past 12 months?no 6. Major health problems in the past 3 months?no 7. Any artificial heart valves, MVP, or defibrillator?no    MEDICATIONS & ALLERGIES:    Patient reports the following regarding taking any anticoagulation/antiplatelet therapy:   Plavix, Coumadin, Eliquis, Xarelto, Lovenox, Pradaxa, Brilinta, or Effient? no Aspirin? no  Patient confirms/reports the following medications:  Current Outpatient Medications  Medication Sig Dispense Refill  . amoxicillin (AMOXIL) 500 MG capsule Take 1 capsule (500 mg total) by mouth 3 (three) times daily. 30 capsule 0  . atorvastatin (LIPITOR) 40 MG tablet Take 1 tablet (40 mg total) by mouth daily. 30 tablet 11  . fexofenadine-pseudoephedrine (ALLEGRA-D) 60-120 MG 12 hr tablet Take 1 tablet by mouth 2 (two) times daily. 20 tablet 0  . HYDROcodone-acetaminophen (NORCO/VICODIN) 5-325 MG tablet Take 1 tablet by mouth every 6 (six) hours as needed for moderate pain. 12 tablet 0  . lisinopril (PRINIVIL,ZESTRIL) 10 MG tablet Take 1 tablet (10 mg total) by mouth daily. 30 tablet 0   No current facility-administered medications for this visit.     Patient confirms/reports the following allergies:  No Known Allergies  No orders of the defined types were placed in this encounter.   AUTHORIZATION INFORMATION Primary Insurance: 1D#: Group #:  Secondary Insurance: 1D#: Group #:  SCHEDULE INFORMATION: Date:  Time: Location:

## 2018-11-10 ENCOUNTER — Other Ambulatory Visit
Admission: RE | Admit: 2018-11-10 | Discharge: 2018-11-10 | Disposition: A | Discharge status: Home / Self Care | Payer: BLUE CROSS/BLUE SHIELD | Source: Ambulatory Visit | Attending: Gastroenterology | Admitting: Gastroenterology

## 2018-11-10 DIAGNOSIS — Z20828 Contact with and (suspected) exposure to other viral communicable diseases: Secondary | ICD-10-CM | POA: Diagnosis not present

## 2018-11-10 DIAGNOSIS — Z01812 Encounter for preprocedural laboratory examination: Secondary | ICD-10-CM | POA: Insufficient documentation

## 2018-11-10 LAB — SARS CORONAVIRUS 2 (TAT 6-24 HRS): SARS Coronavirus 2: NEGATIVE

## 2018-11-13 ENCOUNTER — Encounter: Payer: Self-pay | Admitting: Emergency Medicine

## 2018-11-14 ENCOUNTER — Ambulatory Visit: Payer: BLUE CROSS/BLUE SHIELD

## 2018-11-14 ENCOUNTER — Ambulatory Visit
Admission: RE | Admit: 2018-11-14 | Discharge: 2018-11-14 | Disposition: A | Discharge status: Home / Self Care | Payer: BLUE CROSS/BLUE SHIELD | Attending: Gastroenterology | Admitting: Gastroenterology

## 2018-11-14 ENCOUNTER — Encounter
Admission: RE | Disposition: A | Discharge status: Home / Self Care | Payer: Self-pay | Source: Home / Self Care | Attending: Gastroenterology

## 2018-11-14 ENCOUNTER — Encounter: Payer: Self-pay | Admitting: *Deleted

## 2018-11-14 DIAGNOSIS — Z79899 Other long term (current) drug therapy: Secondary | ICD-10-CM | POA: Insufficient documentation

## 2018-11-14 DIAGNOSIS — F17210 Nicotine dependence, cigarettes, uncomplicated: Secondary | ICD-10-CM | POA: Insufficient documentation

## 2018-11-14 DIAGNOSIS — K635 Polyp of colon: Secondary | ICD-10-CM

## 2018-11-14 DIAGNOSIS — I10 Essential (primary) hypertension: Secondary | ICD-10-CM | POA: Diagnosis not present

## 2018-11-14 DIAGNOSIS — K648 Other hemorrhoids: Secondary | ICD-10-CM | POA: Diagnosis not present

## 2018-11-14 DIAGNOSIS — Z1211 Encounter for screening for malignant neoplasm of colon: Secondary | ICD-10-CM | POA: Diagnosis not present

## 2018-11-14 DIAGNOSIS — D125 Benign neoplasm of sigmoid colon: Secondary | ICD-10-CM | POA: Insufficient documentation

## 2018-11-14 DIAGNOSIS — B1920 Unspecified viral hepatitis C without hepatic coma: Secondary | ICD-10-CM | POA: Diagnosis not present

## 2018-11-14 DIAGNOSIS — K219 Gastro-esophageal reflux disease without esophagitis: Secondary | ICD-10-CM | POA: Diagnosis not present

## 2018-11-14 DIAGNOSIS — Z800 Family history of malignant neoplasm of digestive organs: Secondary | ICD-10-CM | POA: Diagnosis not present

## 2018-11-14 HISTORY — PX: COLONOSCOPY WITH PROPOFOL: SHX5780

## 2018-11-14 SURGERY — COLONOSCOPY WITH PROPOFOL
Anesthesia: General

## 2018-11-14 MED ORDER — SODIUM CHLORIDE 0.9 % IV SOLN
INTRAVENOUS | Status: DC
  Administered 2018-11-14: 08:00:00 via INTRAVENOUS

## 2018-11-14 MED ORDER — PROPOFOL 10 MG/ML IV BOLUS
INTRAVENOUS | Status: DC | PRN
  Administered 2018-11-14 (×2): 20 mg via INTRAVENOUS
  Administered 2018-11-14: 70 mg via INTRAVENOUS
  Administered 2018-11-14: 20 mg via INTRAVENOUS

## 2018-11-14 MED ORDER — PROPOFOL 500 MG/50ML IV EMUL
INTRAVENOUS | Status: DC | PRN
  Administered 2018-11-14: 140 ug/kg/min via INTRAVENOUS

## 2018-11-14 MED ORDER — PROPOFOL 10 MG/ML IV BOLUS
INTRAVENOUS | Status: AC
  Filled 2018-11-14: qty 20

## 2018-11-14 MED ORDER — LIDOCAINE HCL (PF) 2 % IJ SOLN
INTRAMUSCULAR | Status: DC | PRN
  Administered 2018-11-14: 60 mg via INTRADERMAL

## 2018-11-14 NOTE — H&P (Signed)
Jasmine Lame, MD [address] [place], [place] [address] Phone: [phone] Fax : [phone]  Primary Care Physician:  Gennette Pac, FNP Primary Gastroenterologist:  Dr. Allen Norris  Pre-Procedure History & Physical: HPI:  Jasmine Roach is a 53 y.o. female is here for a screening colonoscopy.   Past Medical History:  Diagnosis Date  . GERD (gastroesophageal reflux disease)   . Hepatitis C   . Hypertension     Past Surgical History:  Procedure Laterality Date  . CESAREAN SECTION      Prior to Admission medications   Medication Sig Start Date End Date Taking? Authorizing Provider  amoxicillin (AMOXIL) 500 MG capsule Take 1 capsule (500 mg total) by mouth 3 (three) times daily. 10/12/15   Sable Feil, PA-C  atorvastatin (LIPITOR) 40 MG tablet Take 1 tablet (40 mg total) by mouth daily. 01/12/17 01/12/18  Carrie Mew, MD  fexofenadine-pseudoephedrine (ALLEGRA-D) 60-120 MG 12 hr tablet Take 1 tablet by mouth 2 (two) times daily. Patient not taking: Reported on 11/14/2018 10/12/15   Sable Feil, PA-C  HYDROcodone-acetaminophen (NORCO/VICODIN) 5-325 MG tablet Take 1 tablet by mouth every 6 (six) hours as needed for moderate pain. 04/02/18   Delman Kitten, MD  lisinopril (PRINIVIL,ZESTRIL) 10 MG tablet Take 1 tablet (10 mg total) by mouth daily. 01/12/17 01/12/18  Carrie Mew, MD    Allergies as of 11/02/2018  . (No Known Allergies)    Family History  Problem Relation Age of Onset  . Broken bones Mother        Hip  . Cancer Father        Colon  . Hypertension Father   . Hyperlipidemia Father   . Thyroid disease Brother   . Thyroid disease Son   . Breast cancer Other     Social History   Socioeconomic History  . Marital status: Single    Spouse name: Not on file  . Number of children: Not on file  . Years of education: Not on file  . Highest education level: Not on file  Occupational History  . Not on file  Social Needs  . Financial  resource strain: Not on file  . Food insecurity    Worry: Not on file    Inability: Not on file  . Transportation needs    Medical: Not on file    Non-medical: Not on file  Tobacco Use  . Smoking status: Current Every Day Smoker    Packs/day: 1.00    Types: Cigarettes  . Smokeless tobacco: Never Used  Substance and Sexual Activity  . Alcohol use: Yes    Alcohol/week: 1.0 standard drinks    Types: 1 Cans of beer per week    Comment: One beer once a week.   . Drug use: No  . Sexual activity: Not on file  Lifestyle  . Physical activity    Days per week: Not on file    Minutes per session: Not on file  . Stress: Not on file  Relationships  . Social Herbalist on phone: Not on file    Gets together: Not on file    Attends religious service: Not on file    Active member of club or organization: Not on file    Attends meetings of clubs or organizations: Not on file    Relationship status: Not on file  . Intimate partner violence    Fear of current or ex partner: Not on file  Emotionally abused: Not on file    Physically abused: Not on file    Forced sexual activity: Not on file  Other Topics Concern  . Not on file  Social History Narrative  . Not on file    Review of Systems: See HPI, otherwise negative ROS  Physical Exam: BP 138/89   Pulse 77   Temp (!) 97.4 F (36.3 C) (Tympanic)   Resp 16   Ht 5\' 1"  (1.549 m)   Wt 85.7 kg   LMP 10/12/2015 (Approximate)   SpO2 100%   BMI 35.71 kg/m  General:   Alert,  pleasant and cooperative in NAD Head:  Normocephalic and atraumatic. Neck:  Supple; no masses or thyromegaly. Lungs:  Clear throughout to auscultation.    Heart:  Regular rate and rhythm. Abdomen:  Soft, nontender and nondistended. Normal bowel sounds, without guarding, and without rebound.   Neurologic:  Alert and  oriented x4;  grossly normal neurologically.  Impression/Plan: Jasmine Roach is now here to undergo a screening colonoscopy.   Risks, benefits, and alternatives regarding colonoscopy have been reviewed with the patient.  Questions have been answered.  All parties agreeable.

## 2018-11-14 NOTE — Op Note (Signed)
[facility] Gastroenterology Patient Name: Jasmine Roach Procedure Date: 11/14/2018 8:14 AM MRN: [id number] Account #: [id number] Date of Birth: 13-Aug-1965 Admit Type: Outpatient Age: 53 Room: [facility] ENDO ROOM 4 Gender: Female Note Status: Finalized Procedure:            Colonoscopy Indications:          Screening for colorectal malignant neoplasm Providers:            [name] MD, MD Referring MD:         No Local Md, MD (Referring MD) Medicines:            Propofol per Anesthesia Complications:        No immediate complications. Procedure:            Pre-Anesthesia Assessment:                       - Prior to the procedure, a History and Physical was                        performed, and patient medications and allergies were                        reviewed. The patient's tolerance of previous                        anesthesia was also reviewed. The risks and benefits of                        the procedure and the sedation options and risks were                        discussed with the patient. All questions were                        answered, and informed consent was obtained. Prior                        Anticoagulants: The patient has taken no previous                        anticoagulant or antiplatelet agents. ASA Grade                        Assessment: II - A patient with mild systemic disease.                        After reviewing the risks and benefits, the patient was                        deemed in satisfactory condition to undergo the                        procedure.                       After obtaining informed consent, the colonoscope was                        passed under direct vision. Throughout the procedure,  the patient's blood pressure, pulse, and oxygen                        saturations were monitored continuously. The                        Colonoscope was introduced through the anus and        advanced to the the cecum, identified by appendiceal                        orifice and ileocecal valve. The colonoscopy was                        performed without difficulty. The patient tolerated the                        procedure well. The quality of the bowel preparation                        was excellent. Findings:      The perianal and digital rectal examinations were normal.      Four sessile polyps were found in the sigmoid colon. The polyps were 2       to 3 mm in size. These polyps were removed with a cold biopsy forceps.       Resection and retrieval were complete.      Non-bleeding internal hemorrhoids were found during retroflexion. The       hemorrhoids were Grade I (internal hemorrhoids that do not prolapse). Impression:           - Four 2 to 3 mm polyps in the sigmoid colon, removed                        with a cold biopsy forceps. Resected and retrieved.                       - Non-bleeding internal hemorrhoids. Recommendation:       - Discharge patient to home.                       - Resume previous diet.                       - Continue present medications.                       - Await pathology results.                       - Repeat colonoscopy in 5 years if polyp adenoma and 10                        years if hyperplastic Procedure Code(s):    --- Professional ---                       [phone], Colonoscopy, flexible; with biopsy, single or                        multiple Diagnosis Code(s):    --- Professional ---  Z12.11, Encounter for screening for malignant neoplasm                        of colon                       K63.5, Polyp of colon CPT copyright 2019 American Medical Association. All rights reserved. The codes documented in this report are preliminary and upon coder review may  be revised to meet current compliance requirements. [name] MD, MD 11/14/2018 8:40:10 AM This report has been signed electronically. Number  of Addenda: 0 Note Initiated On: 11/14/2018 8:14 AM Scope Withdrawal Time: 0 hours 10 minutes 26 seconds  Total Procedure Duration: 0 hours 16 minutes 48 seconds  Estimated Blood Loss: Estimated blood loss: none.      [facility]

## 2018-11-14 NOTE — Transfer of Care (Signed)
Immediate Anesthesia Transfer of Care Note  Patient: Jasmine Roach  Procedure(s) Performed: COLONOSCOPY WITH PROPOFOL (N/A )  Patient Location: PACU  Anesthesia Type:General   Level of Consciousness: awake and drowsy  Airway & Oxygen Therapy: Patient Spontanous Breathing and Patient connected to nasal cannula oxygen  Post-op Assessment: Report given to RN and Post -op Vital signs reviewed and stable  Post vital signs: Reviewed and stable  Last Vitals:  Vitals Value Taken Time  BP 109/66 11/14/18 0841  Temp 36.1 C 11/14/18 0840  Pulse 74 11/14/18 0841  Resp 12 11/14/18 0841  SpO2 100 % 11/14/18 0841  Vitals shown include unvalidated device data.  Last Pain:  Vitals:   11/14/18 0840  TempSrc: Tympanic  PainSc: 0-No pain         Complications: No apparent anesthesia complications

## 2018-11-14 NOTE — Anesthesia Postprocedure Evaluation (Signed)
Anesthesia Post Note  Patient: Jasmine Roach  Procedure(s) Performed: COLONOSCOPY WITH PROPOFOL (N/A )  Patient location during evaluation: Endoscopy Anesthesia Type: General Level of consciousness: awake and alert Pain management: pain level controlled Vital Signs Assessment: post-procedure vital signs reviewed and stable Respiratory status: spontaneous breathing, nonlabored ventilation, respiratory function stable and patient connected to nasal cannula oxygen Cardiovascular status: blood pressure returned to baseline and stable Postop Assessment: no apparent nausea or vomiting Anesthetic complications: no     Last Vitals:  Vitals:   11/14/18 0840 11/14/18 0910  BP: 109/66 132/85  Pulse: 77   Resp: 19   Temp: (!) 36.1 C   SpO2: 100%     Last Pain:  Vitals:   11/14/18 0910  TempSrc:   PainSc: 0-No pain                 Precious Haws Piscitello

## 2018-11-14 NOTE — Anesthesia Preprocedure Evaluation (Addendum)
Anesthesia Evaluation  Patient identified by MRN, date of birth, ID band Patient awake    Reviewed: Allergy & Precautions, H&P , NPO status , Patient's Chart, lab work & pertinent test results, reviewed documented beta blocker date and time   Airway Mallampati: II   Neck ROM: full    Dental  (+) Poor Dentition   Pulmonary neg pulmonary ROS, Current SmokerPatient did not abstain from smoking.,    Pulmonary exam normal        Cardiovascular Exercise Tolerance: Poor hypertension, On Medications negative cardio ROS Normal cardiovascular exam Rhythm:regular Rate:Normal     Neuro/Psych negative neurological ROS  negative psych ROS   GI/Hepatic GERD  Medicated and Controlled,(+) Hepatitis -, C  Endo/Other  negative endocrine ROS  Renal/GU negative Renal ROS  negative genitourinary   Musculoskeletal   Abdominal   Peds  Hematology negative hematology ROS (+)   Anesthesia Other Findings Past Medical History: No date: GERD (gastroesophageal reflux disease) No date: Hepatitis C No date: Hypertension Past Surgical History: No date: CESAREAN SECTION BMI    Body Mass Index: 35.71 kg/m     Reproductive/Obstetrics negative OB ROS                            Anesthesia Physical Anesthesia Plan  ASA: III  Anesthesia Plan: General   Post-op Pain Management:    Induction: Intravenous  PONV Risk Score and Plan:   Airway Management Planned: Natural Airway and Nasal Cannula  Additional Equipment:   Intra-op Plan:   Post-operative Plan:   Informed Consent: I have reviewed the patients History and Physical, chart, labs and discussed the procedure including the risks, benefits and alternatives for the proposed anesthesia with the patient or authorized representative who has indicated his/her understanding and acceptance.     Dental Advisory Given  Plan Discussed with: CRNA  Anesthesia  Plan Comments: (Patient consented for risks of anesthesia including but not limited to:  - adverse reactions to medications - risk of intubation if required - damage to teeth, lips or other oral mucosa - sore throat or hoarseness - Damage to heart, brain, lungs or loss of life  Patient voiced understanding.)       Anesthesia Quick Evaluation

## 2018-11-14 NOTE — Anesthesia Post-op Follow-up Note (Signed)
Anesthesia QCDR form completed.        

## 2018-11-15 ENCOUNTER — Encounter: Payer: Self-pay | Admitting: Gastroenterology

## 2018-11-15 LAB — SURGICAL PATHOLOGY

## 2019-03-06 ENCOUNTER — Other Ambulatory Visit: Payer: Self-pay | Admitting: Family Medicine

## 2019-03-06 ENCOUNTER — Other Ambulatory Visit: Payer: Self-pay | Admitting: Physician Assistant

## 2019-03-06 DIAGNOSIS — Z1231 Encounter for screening mammogram for malignant neoplasm of breast: Secondary | ICD-10-CM

## 2019-04-09 ENCOUNTER — Ambulatory Visit
Admission: RE | Admit: 2019-04-09 | Discharge: 2019-04-09 | Disposition: A | Discharge status: Home / Self Care | Payer: BLUE CROSS/BLUE SHIELD | Source: Ambulatory Visit | Attending: Physician Assistant | Admitting: Physician Assistant

## 2019-04-09 DIAGNOSIS — Z1231 Encounter for screening mammogram for malignant neoplasm of breast: Secondary | ICD-10-CM | POA: Insufficient documentation

## 2020-07-03 ENCOUNTER — Other Ambulatory Visit: Payer: Self-pay | Admitting: Physician Assistant

## 2020-07-03 DIAGNOSIS — Z1231 Encounter for screening mammogram for malignant neoplasm of breast: Secondary | ICD-10-CM

## 2020-07-09 ENCOUNTER — Ambulatory Visit
Admission: RE | Admit: 2020-07-09 | Discharge: 2020-07-09 | Disposition: A | Discharge status: Home / Self Care | Payer: BC Managed Care – PPO | Source: Ambulatory Visit | Attending: Physician Assistant | Admitting: Physician Assistant

## 2020-07-09 ENCOUNTER — Other Ambulatory Visit: Payer: Self-pay

## 2020-07-09 DIAGNOSIS — Z1231 Encounter for screening mammogram for malignant neoplasm of breast: Secondary | ICD-10-CM | POA: Insufficient documentation

## 2021-06-18 ENCOUNTER — Other Ambulatory Visit: Payer: Self-pay | Admitting: Family Medicine

## 2021-06-18 DIAGNOSIS — Z1231 Encounter for screening mammogram for malignant neoplasm of breast: Secondary | ICD-10-CM

## 2021-08-10 IMAGING — MG MM DIGITAL SCREENING BILAT W/ TOMO AND CAD
8 series · 8 of 24 positions shown · non-contrast
Comparison: Previous exam(s).

CLINICAL DATA: Screening.

EXAM:
DIGITAL SCREENING BILATERAL MAMMOGRAM WITH TOMOSYNTHESIS AND CAD
TECHNIQUE: Bilateral screening digital craniocaudal and mediolateral oblique
mammograms were obtained. Bilateral screening digital breast
tomosynthesis was performed. The images were evaluated with
computer-aided detection.

[R MLO synth-2D]
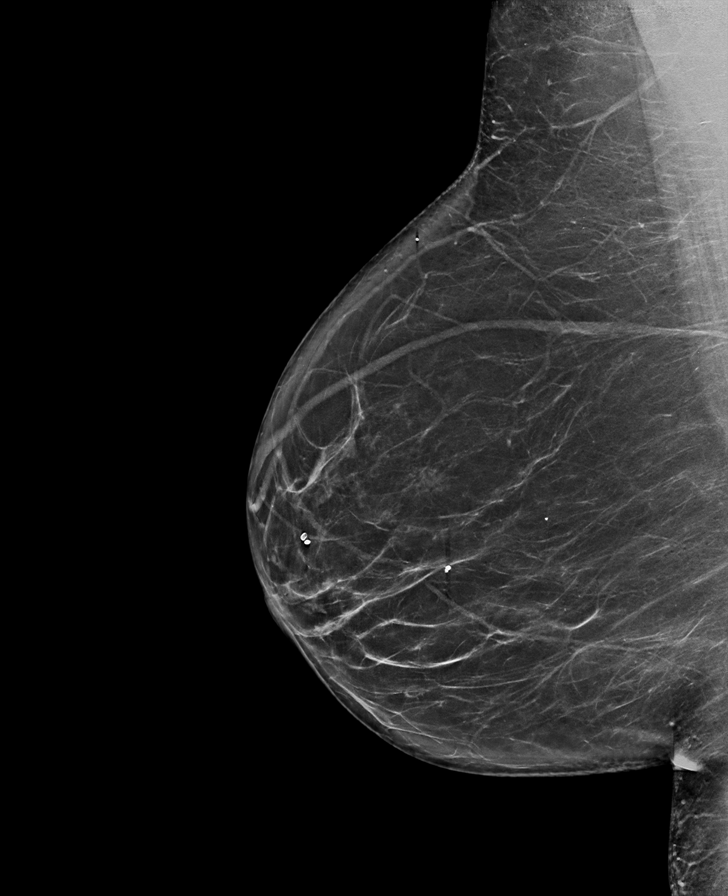

[L MLO synth-2D]
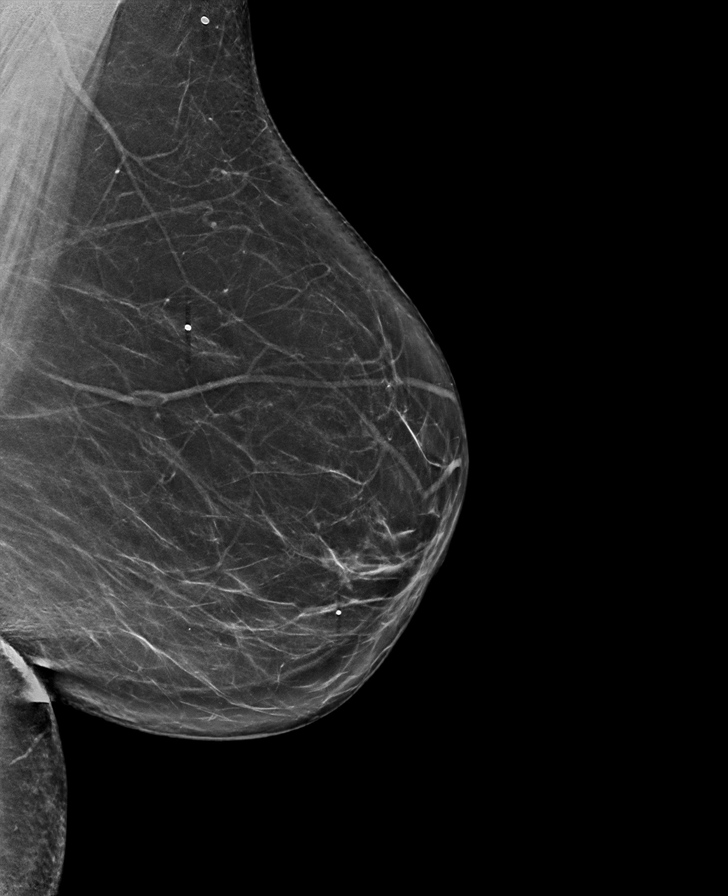

[R CC synth-2D]
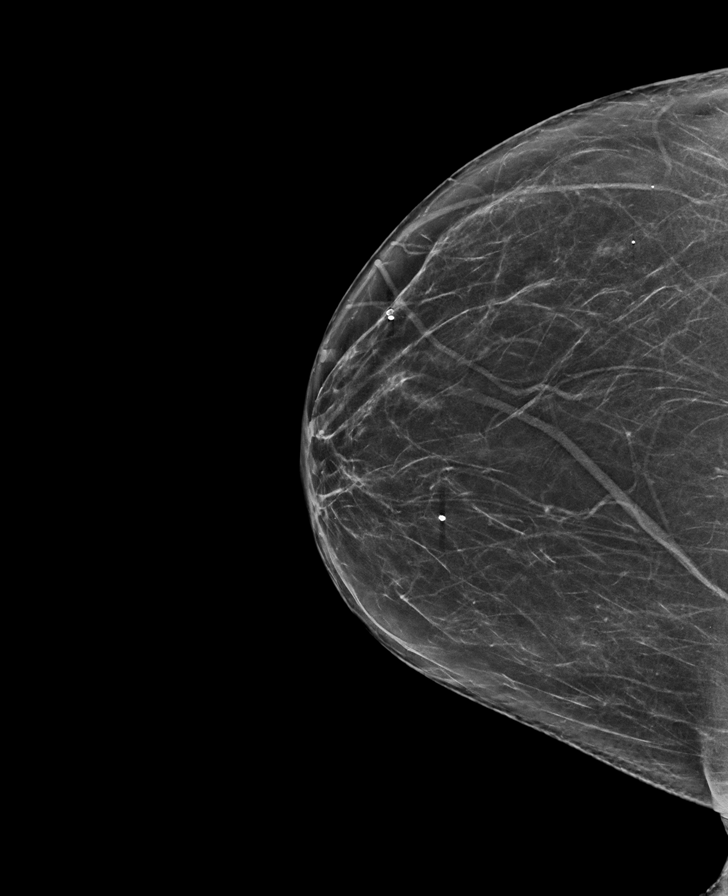

[L CC synth-2D]
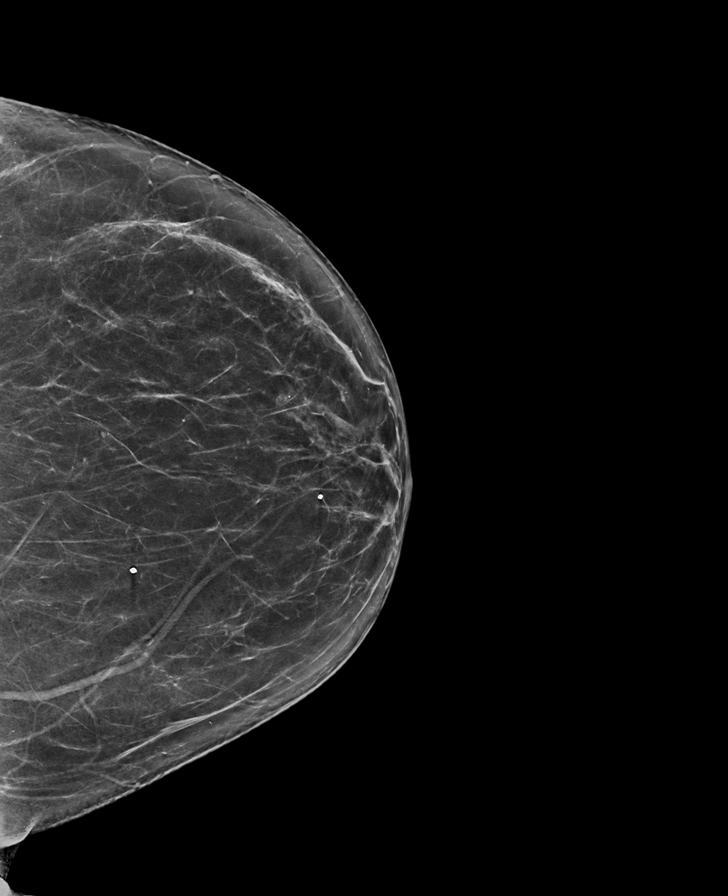

[L CC tomo · tomo slice 37/74.0]
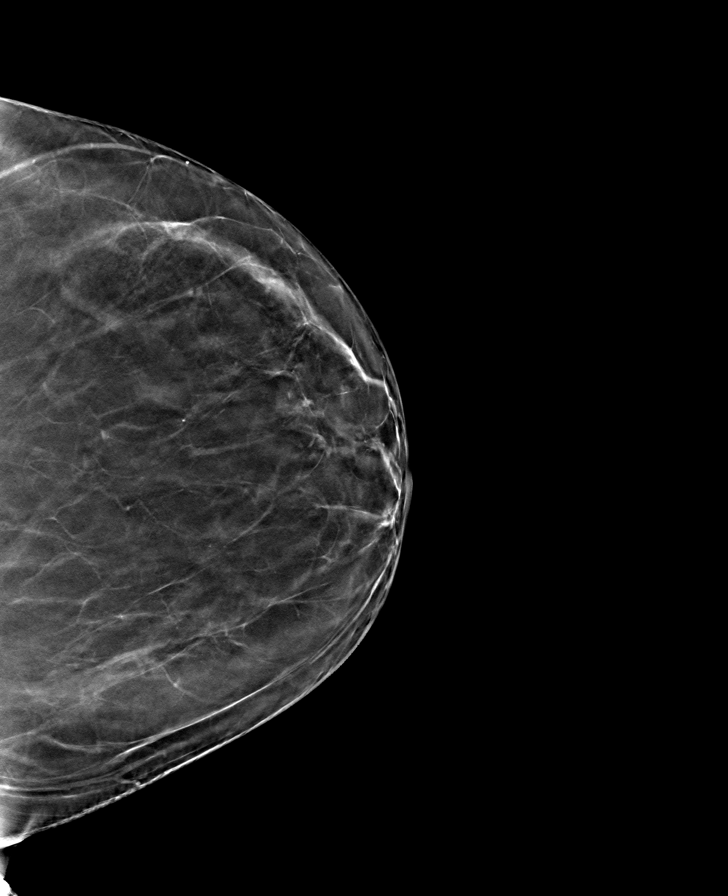

[L MLO tomo · tomo slice 41/82.0]
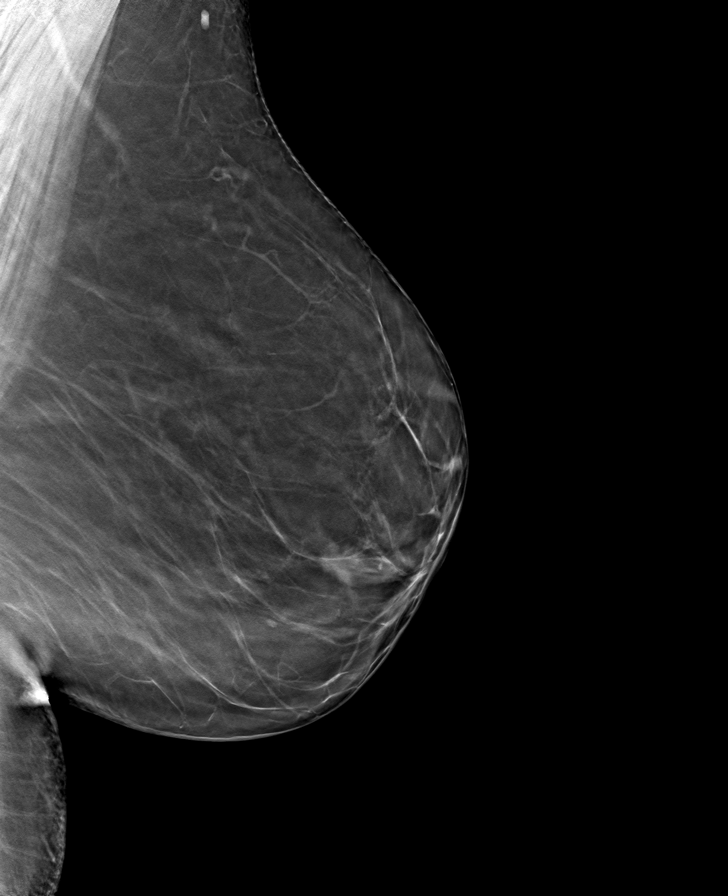

[R CC tomo · tomo slice 37/73.0]
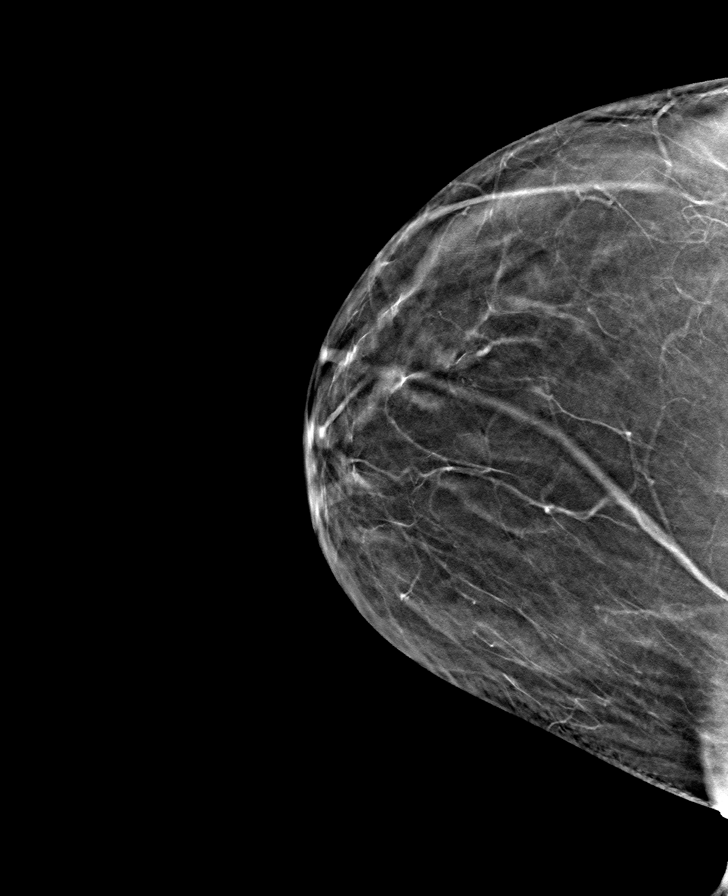

[R MLO tomo · tomo slice 43/86.0]
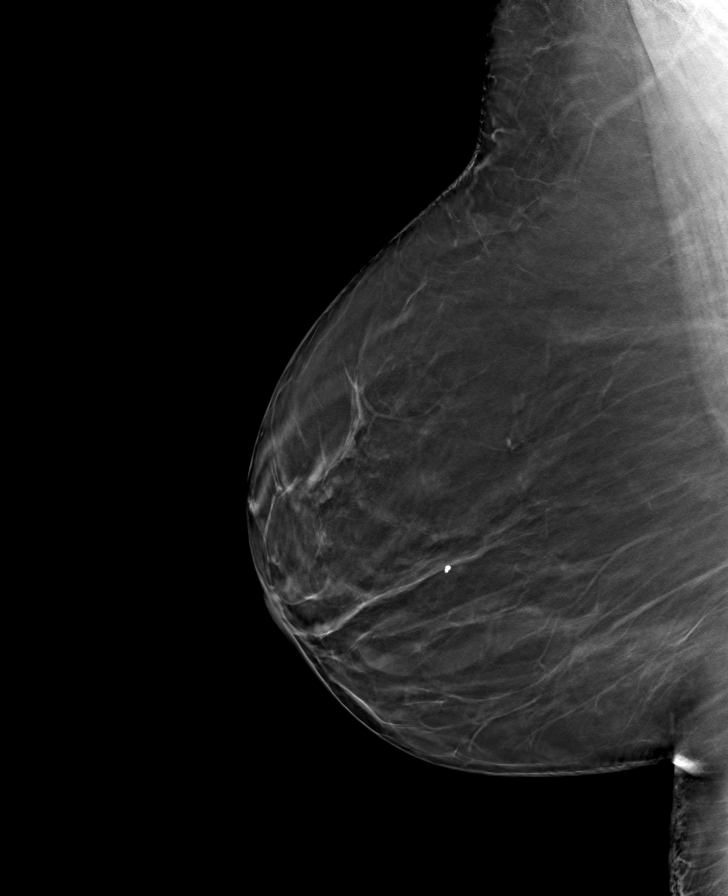

[8 of 24 positions shown; findings below may reference images not displayed]

ACR Breast Density Category b: There are scattered areas of
fibroglandular density.
FINDINGS: There are no findings suspicious for malignancy. The images were
evaluated with computer-aided detection.
IMPRESSION: No mammographic evidence of malignancy. A result letter of this
screening mammogram will be mailed directly to the patient.

RECOMMENDATION:
Screening mammogram in one year. (Code:WJ-I-BG6)

BI-RADS CATEGORY  1: Negative.

## 2021-08-25 ENCOUNTER — Ambulatory Visit
Admission: RE | Admit: 2021-08-25 | Discharge: 2021-08-25 | Disposition: A | Discharge status: Home / Self Care | Payer: BC Managed Care – PPO | Source: Ambulatory Visit | Attending: Family Medicine | Admitting: Family Medicine

## 2021-08-25 DIAGNOSIS — Z1231 Encounter for screening mammogram for malignant neoplasm of breast: Secondary | ICD-10-CM | POA: Diagnosis present

## 2022-03-17 ENCOUNTER — Institutional Professional Consult (permissible substitution): Payer: BC Managed Care – PPO | Admitting: Pulmonary Disease

## 2022-03-17 ENCOUNTER — Encounter: Payer: Self-pay | Admitting: Student in an Organized Health Care Education/Training Program

## 2022-03-17 ENCOUNTER — Ambulatory Visit: Payer: BC Managed Care – PPO | Admitting: Student in an Organized Health Care Education/Training Program

## 2022-03-17 VITALS — BP 118/70 | HR 71 | Temp 97.6°F | Ht 61.0 in | Wt 184.0 lb

## 2022-03-17 DIAGNOSIS — J449 Chronic obstructive pulmonary disease, unspecified: Secondary | ICD-10-CM

## 2022-03-17 DIAGNOSIS — R0602 Shortness of breath: Secondary | ICD-10-CM

## 2022-03-17 MED ORDER — ANORO ELLIPTA 62.5-25 MCG/ACT IN AEPB: 1.0000 | INHALATION_SPRAY | Freq: Every day | RESPIRATORY_TRACT | 11 refills | Status: AC

## 2022-03-17 NOTE — Progress Notes (Signed)
Synopsis: Referred in for shortness of breath by Gennette Pac, FNP  Assessment & Plan:   1. Shortness of breath 2. Chronic obstructive pulmonary disease 3. Tobacco Use  Presenting for the evaluation of shortness of breath in the setting of long standing smoking history. She was started on Spiriva HandiHaler by her outpatient provider. High risk for COPD based on history and symptoms, will order PFT's to confirm. I will also change her inhaler to LAMA/LABA with Anoro Ellipta.    Patient was given counseling regarding smoking cessation and I will refer her to our lung cancer screening program.  - umeclidinium-vilanterol (ANORO ELLIPTA) 62.5-25 MCG/ACT AEPB; Inhale 1 puff into the lungs daily.  Dispense: 30 each; Refill: 11 - Pulmonary Function Test [facility] Only; Future - Ambulatory Referral for Lung Cancer Scre  Return in about 3 months (around 06/15/2022).  I spent 50 minutes caring for this patient today, including preparing to see the patient, obtaining a medical history , reviewing a separately obtained history, performing a medically appropriate examination and/or evaluation, counseling and educating the patient/family/caregiver, ordering medications, tests, or procedures, documenting clinical information in the electronic health record, and independently interpreting results (not separately reported/billed) and communicating results to the patient/family/caregiver  Armando Reichert, MD [facility] 03/17/2022 10:20 AM    End of visit medications:  Meds ordered this encounter  Medications   umeclidinium-vilanterol (ANORO ELLIPTA) 62.5-25 MCG/ACT AEPB    Sig: Inhale 1 puff into the lungs daily.    Dispense:  30 each    Refill:  11     Current Outpatient Medications:    albuterol (VENTOLIN HFA) 108 (90 Base) MCG/ACT inhaler, 2 puffs every 6 (six) hours as needed., Disp: , Rfl:    fexofenadine-pseudoephedrine (ALLEGRA-D) 60-120 MG 12 hr tablet, Take 1 tablet by  mouth 2 (two) times daily., Disp: 20 tablet, Rfl: 0   HYDROcodone-acetaminophen (NORCO/VICODIN) 5-325 MG tablet, Take 1 tablet by mouth every 6 (six) hours as needed for moderate pain., Disp: 12 tablet, Rfl: 0   umeclidinium-vilanterol (ANORO ELLIPTA) 62.5-25 MCG/ACT AEPB, Inhale 1 puff into the lungs daily., Disp: 30 each, Rfl: 11   atorvastatin (LIPITOR) 40 MG tablet, Take 1 tablet (40 mg total) by mouth daily., Disp: 30 tablet, Rfl: 11   lisinopril (PRINIVIL,ZESTRIL) 10 MG tablet, Take 1 tablet (10 mg total) by mouth daily., Disp: 30 tablet, Rfl: 0   Subjective:   PATIENT ID: Jasmine Roach GENDER: female DOB: 05-07-1965, MRN: [id number]  Chief Complaint  Patient presents with   pulmonary consult    Dry cough and wheezing.     HPI  57 year old female presenting to clinic for the evaluation of shortness of breath.  She is in the presence of her husband.  Patient report concern for lung cancer and would like to be evaluated for that as well.  She reports shortness of breath with exertion, especially going up stairs and walking long distances.  She reports a cough that is chronic, throughout the day, and sometimes productive of clear sputum.  She denies any hemoptysis.  She denies any fevers, chills, night sweats, or weight loss.  Patient does not have any tightness and does not report wheezing.  She was seen by her primary care physician's office and was prescribed Spiriva HandiHaler with some improvement.  She is also using albuterol as needed, but mostly uses it in the morning. She has not had any breathing problems in the past and reports no personal history of asthma. Patient reports  concern for lung cancer and would like to be evaluated for that. 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  Patient works at [employer] and has no limitations at work secondary to her shortness of breath.  She is a smoker (at least 44 pack  years) and is contemplating quitting.  She currently has nicotine patches to aid in smoking cessation.   Ancillary information including prior medications, full medical/surgical/family/social histories, and PFTs (when available) are listed below and have been reviewed.   Review of Systems  Constitutional:  Negative for chills, fever, malaise/fatigue and weight loss.  Respiratory:  Positive for cough and shortness of breath. Negative for hemoptysis, sputum production and wheezing.   Cardiovascular:  Negative for chest pain.  Skin:  Negative for rash.     Objective:   Vitals:   03/17/22 1006  BP: 118/70  Pulse: 71  Temp: 97.6 F (36.4 C)  TempSrc: Temporal  SpO2: 96%  Weight: 184 lb (83.5 kg)  Height: '5\' 1"'$  (1.549 m)   96% on RA  BMI Readings from Last 3 Encounters:  03/17/22 34.77 kg/m  11/14/18 35.71 kg/m  04/02/18 33.63 kg/m   Wt Readings from Last 3 Encounters:  03/17/22 184 lb (83.5 kg)  11/14/18 189 lb (85.7 kg)  04/02/18 178 lb (80.7 kg)    Physical Exam Constitutional:      Appearance: She is obese. She is not ill-appearing.  HENT:     Head: Normocephalic.     Mouth/Throat:     Mouth: Mucous membranes are moist.  Cardiovascular:     Rate and Rhythm: Normal rate and regular rhythm.     Pulses: Normal pulses.     Heart sounds: Normal heart sounds.  Pulmonary:     Effort: Pulmonary effort is normal.     Breath sounds: Normal breath sounds. No wheezing or rales.  Abdominal:     General: There is distension.  Neurological:     General: No focal deficit present.     Mental Status: She is alert and oriented to person, place, and time. Mental status is at baseline.     Ancillary Information    Past Medical History:  Diagnosis Date   GERD (gastroesophageal reflux disease)    Hepatitis C    Hypertension      Family History  Problem Relation Age of Onset   Broken bones Mother        Hip   Cancer Father        Colon   Hypertension Father     Hyperlipidemia Father    Thyroid disease Brother    Thyroid disease Son    Breast cancer Other      Past Surgical History:  Procedure Laterality Date   CESAREAN SECTION     COLONOSCOPY WITH PROPOFOL N/A 11/14/2018   Procedure: COLONOSCOPY WITH PROPOFOL;  Surgeon: [name], MD;  Location: [facility] ENDOSCOPY;  Service: Endoscopy;  Laterality: N/A;    Social History   Socioeconomic History   Marital status: Single    Spouse name: Not on file   Number of children: Not on file   Years of education: Not on file   Highest education level: Not on file  Occupational History   Not on file  Tobacco Use   Smoking status: Every Day    Packs/day: 1.00    Years: 43.00    Total pack years: 43.00    Types: Cigarettes  Smokeless tobacco: Never  Substance and Sexual Activity   Alcohol use: Yes    Alcohol/week: 1.0 standard drink of alcohol    Types: 1 Cans of beer per week    Comment: One beer once a week.    Drug use: No   Sexual activity: Not on file  Other Topics Concern   Not on file  Social History Narrative   Not on file   Social Determinants of Health   Financial Resource Strain: Not on file  Food Insecurity: Not on file  Transportation Needs: Not on file  Physical Activity: Not on file  Stress: Not on file  Social Connections: Not on file  Intimate Partner Violence: Not on file     No Known Allergies   CBC    Component Value Date/Time   WBC 8.3 04/02/2018 1336   RBC 4.78 04/02/2018 1336   HGB 12.6 04/02/2018 1336   HCT 39.9 04/02/2018 1336   PLT 315 04/02/2018 1336   MCV 83.5 04/02/2018 1336   MCH 26.4 04/02/2018 1336   MCHC 31.6 04/02/2018 1336   RDW 13.3 04/02/2018 1336    Pulmonary Functions Testing Results:     No data to display          Outpatient Medications Prior to Visit  Medication Sig Dispense Refill   albuterol (VENTOLIN HFA) 108 (90 Base) MCG/ACT inhaler 2 puffs every 6 (six) hours as needed.     fexofenadine-pseudoephedrine  (ALLEGRA-D) 60-120 MG 12 hr tablet Take 1 tablet by mouth 2 (two) times daily. 20 tablet 0   HYDROcodone-acetaminophen (NORCO/VICODIN) 5-325 MG tablet Take 1 tablet by mouth every 6 (six) hours as needed for moderate pain. 12 tablet 0   amoxicillin (AMOXIL) 500 MG capsule Take 1 capsule (500 mg total) by mouth 3 (three) times daily. 30 capsule 0   atorvastatin (LIPITOR) 40 MG tablet Take 1 tablet (40 mg total) by mouth daily. 30 tablet 11   lisinopril (PRINIVIL,ZESTRIL) 10 MG tablet Take 1 tablet (10 mg total) by mouth daily. 30 tablet 0   No facility-administered medications prior to visit.

## 2022-03-17 NOTE — Patient Instructions (Signed)
The [facility] Quitline: Call 1-800-QUIT-NOW 575-707-4353). The [place] Quitline is a free service for [employer]. Trained counselors are available from 8 am until 3 am, 365 days per year. Services are available in both [place] and [place].   Web Resources Free online support programs can help you track your progress and share experiences with others who are quitting. These are examples: www.becomeanex.org www.trytostop.org  www.smokefree.gov  www.[employer].com.br.aspx  [facility]: offers comprehensive in-person tobacco treatment counseling at [place] [facility] ([address], [place] [address]).  Open to everyone. Virtual appointments available. Free parking. Call [phone] to schedule an appointment or [phone] for general information.    Tobacco Cessation Medications  Nicotine Replacement Therapy (NRT)  Nicotine is the addictive part of tobacco smoke, but not the most dangerous part. There are 7000 other toxins in cigarettes, including carbon monoxide, that cause disease. People do not generally become addicted to medication. Common problems: People don't use enough medication or stop too early. Medications are safe and effective. Overdose is very uncommon. Use medications as long as needed (3 months minimum). Some combinations work better than single medications. Long acting medications like the NRT patch and bupropion provide continuous treatment for withdrawal symptoms.  PLUS  Short acting medications like the NRT gum, lozenge, inhaler, and nasal spray help people to cope with breakthrough cravings.  ? Nicotine Patch  Place patch on hairless skin on upper body, including arms and back. Each day: discard old patch, shower, apply new patch to a different site. Apply hydrocortisone cream to mildly red/irritated areas. Call provider if rash develops. If patch causes sleep disturbance, remove patch  at bedtime and replace each morning after shower. Side effects may include: skin irritation, headache, insomnia, abnormal/vivid dreams.  ? Nicotine Gum  Chew gum slowly, park in cheek when peppery taste or tingling sensation begins (about 15-30 chews). When taste or tingling goes away, begin chewing again. Use until nicotine is gone (taste or tingle does not return, usually 30 minutes). Park in different areas of mouth. Nicotine is absorbed through the lining of the mouth. Use enough to control cravings, up to 24 pieces per day (if used alone). Avoid eating or drinking for 15 minutes before using and during use. Side effects may include: mouth/jaw soreness, hiccups, indigestion, hypersalivation.  If gum is not chewed correctly, additional side effects may include lightheadedness, nausea/vomiting, throat and mouth irritation.  ? Nicotine Lozenge  Allow to dissolve slowly in mouth (20-30 minutes). Do not chew or swallow. Nicotine release may cause a warm tingling sensation. Occasionally rotate to different areas of the mouth. Use enough to control cravings, up to 20 lozenges per day (if used alone). Avoid eating or drinking for 15 minutes before using and during use. Side effects may include: nausea, hiccups, cough, heartburn, headache, gas, insomnia.  ? Nicotine Nasal Spray Use 1 spray in each nostril (1 dose) and tilt head back for 1 minute. Do not sniff, swallow, or inhale through nose.  Use at least 8 doses (1 spray in each nostril) , up to 40 doses per day (if used alone). To reduce nasal irritation, spray on cotton swab and insert into nose. Side effects may include: nasal and/or throat irritation (hot, peppery, or burning sensation), nasal irritation, tearing, sneezing, cough, headache.  ? Nicotine Oral Inhaler (puffer) Inhale into the back of the throat or puff in short breaths. Do not inhale into the lungs.  Puff continuously for 20 minutes (about 80 puffs) until cartridge  is  empty. Change cartridge when it loses the "burning in throat" sensation (feels like air only). Open cartridges can be saved and used again within 24 hours. Use at least 6 and up to 16 cartridges per day (if used alone).  Avoid eating or drinking for 15 minutes before using and during use. Side effects may include: mouth and/or throat irritation, unpleasant taste, cough, nasal irritation, indigestion, hiccups, headache.  ? Chantix (varenicline) Days 1-3: Take one 0.5 mg white pill each morning for 3 days, one week before quit date. Days 4-7: Increase to one 0.5 mg white pill twice a day in morning and evening for 4 days.  On Day 8 (target quit date), increase to one 1 mg blue pill twice a day. Maintain this dose for a minimum of 3 months. Take with food and a full glass of water to reduce nausea. Be sure that the two doses are at least 8 hours apart, but try to take second dose early in the evening (i.e. 6 pm) to avoid sleep problems. Common side effects include: nausea, insomnia, headache, abnormal/vivid dreams. Tell your doctor if you have any history of psychiatric illness prior to starting Chantix.  STOP taking CHANTIX and contact a healthcare provider immediately if you experience agitation, hostility, depressed mood, changes in thoughts or behavior that are not typical for you, thinking about or attempting suicide, allergic or skin reactions including swelling, rash, redness, or peeling of the skin.  For patients who have heart disease: Smoking is a major risk factor for cardiovascular disease, and Chantix can help you quit smoking. Chantix may be associated with a small, increased risk of certain heart events in patients who have heart disease. If you have any new or worsening symptoms of heart disease while taking Chantix, such as shortness of breath or trouble breathing, new or worsening chest pain, or new or worsening pain in your legs when walking, call your doctor or get emergency medical  help immediately.  ? Wellbutrin / Zyban (bupropion) Take one 150 mg pill each morning for 3 days, one week before target quit date. On Day 4, increase to one 150 mg pill twice a day, morning and evening.  Maintain this dose for a minimum of 3 months. Be sure that the two doses are at least 8 hours apart, but try to take second dose early in the evening (i.e. 6 pm) to avoid sleep problems. Avoid or minimize use of alcohol when taking this medication. Common side effects include: dry mouth, headache, insomnia, nausea, weight loss.  Risk of seizure is 02/998. STOP taking BUPROPION and contact a healthcare provider immediately if you experience agitation, hostility, depressed mood, changes in thoughts or behavior that are not typical for you, thinking about or attempting suicide, allergic or skin reactions including swelling, rash, redness, or peeling of the skin.

## 2022-05-18 ENCOUNTER — Encounter: Payer: Self-pay | Admitting: *Deleted

## 2022-07-15 ENCOUNTER — Other Ambulatory Visit: Payer: Self-pay | Admitting: *Deleted

## 2022-07-15 ENCOUNTER — Other Ambulatory Visit: Payer: Self-pay | Admitting: Family Medicine

## 2022-07-15 DIAGNOSIS — Z1231 Encounter for screening mammogram for malignant neoplasm of breast: Secondary | ICD-10-CM

## 2022-07-15 DIAGNOSIS — Z122 Encounter for screening for malignant neoplasm of respiratory organs: Secondary | ICD-10-CM

## 2022-07-15 DIAGNOSIS — Z87891 Personal history of nicotine dependence: Secondary | ICD-10-CM

## 2022-08-11 ENCOUNTER — Encounter: Payer: Self-pay | Admitting: Acute Care

## 2022-08-11 ENCOUNTER — Ambulatory Visit (INDEPENDENT_AMBULATORY_CARE_PROVIDER_SITE_OTHER): Payer: BC Managed Care – PPO | Admitting: Acute Care

## 2022-08-11 ENCOUNTER — Ambulatory Visit: Payer: BC Managed Care – PPO

## 2022-08-11 DIAGNOSIS — F17210 Nicotine dependence, cigarettes, uncomplicated: Secondary | ICD-10-CM

## 2022-08-11 NOTE — Progress Notes (Signed)
Virtual Visit via Telephone Note  I connected with Jasmine Roach on 08/11/22 at 11:30 AM EDT by telephone and verified that I am speaking with the correct person using two identifiers.  Location: Patient:  At home Provider: [address] [place], [place], [address]    I discussed the limitations, risks, security and privacy concerns of performing an evaluation and management service by telephone and the availability of in person appointments. I also discussed with the patient that there may be a patient responsible charge related to this service. The patient expressed understanding and agreed to proceed.   Shared Decision Making Visit Lung Cancer Screening Program [phone])   Eligibility: Age 57 y.o. Pack Years Smoking History Calculation 44 pack year smoking history (# packs/per year x # years smoked) Recent History of coughing up blood  no Unexplained weight loss? no ( >Than 15 pounds within the last 6 months ) Prior History Lung / other cancer no (Diagnosis within the last 5 years already requiring surveillance chest CT Scans). Smoking Status Current Smoker Former Smokers: Years since quit:  NA  Quit Date:  NA  Visit Components: Discussion included one or more decision making aids. yes Discussion included risk/benefits of screening. yes Discussion included potential follow up diagnostic testing for abnormal scans. yes Discussion included meaning and risk of over diagnosis. yes Discussion included meaning and risk of False Positives. yes Discussion included meaning of total radiation exposure. yes  Counseling Included: Importance of adherence to annual lung cancer LDCT screening. yes Impact of comorbidities on ability to participate in the program. yes Ability and willingness to under diagnostic treatment. yes  Smoking Cessation Counseling: Current Smokers:  Discussed importance of smoking cessation. yes Information about tobacco cessation classes and  interventions provided to patient. yes Patient provided with "ticket" for LDCT Scan. yes Symptomatic Patient. no  Counseling NA Diagnosis Code: Tobacco Use Z72.0 Asymptomatic Patient yes  Counseling (Intermediate counseling: > three minutes counseling) U0454 Former Smokers:  Discussed the importance of maintaining cigarette abstinence. yes Diagnosis Code: Personal History of Nicotine Dependence. U98.119 Information about tobacco cessation classes and interventions provided to patient. Yes Patient provided with "ticket" for LDCT Scan. yes Written Order for Lung Cancer Screening with LDCT placed in Epic. Yes (CT Chest Lung Cancer Screening Low Dose W/O CM) [id number] Z12.2-Screening of respiratory organs Z87.891-Personal history of nicotine dependence  I have spent 25 minutes of face to face/ virtual visit   time with  Jasmine Roach discussing the risks and benefits of lung cancer screening. We viewed / discussed a power point together that explained in detail the above noted topics. We paused at intervals to allow for questions to be asked and answered to ensure understanding.We discussed that the single most powerful action that she can take to decrease her risk of developing lung cancer is to quit smoking. We discussed whether or not she is ready to commit to setting a quit date. We discussed options for tools to aid in quitting smoking including nicotine replacement therapy, non-nicotine medications, support groups, Quit Smart classes, and behavior modification. We discussed that often times setting smaller, more achievable goals, such as eliminating 1 cigarette a day for a week and then 2 cigarettes a day for a week can be helpful in slowly decreasing the number of cigarettes smoked. This allows for a sense of accomplishment as well as providing a clinical benefit. I provided  her  with smoking cessation  information  with contact information for community resources, classes, free  nicotine  replacement therapy, and access to mobile apps, text messaging, and on-line smoking cessation help. I have also provided  her  the office contact information in the event she needs to contact me, or the screening staff. We discussed the time and location of the scan, and that either Abigail Miyamoto RN, Karlton Lemon, RN  or I will call / send a letter with the results within 24-72 hours of receiving them. The patient verbalized understanding of all of  the above and had no further questions upon leaving the office. They have my contact information in the event they have any further questions.  I spent 3-4 minutes counseling on smoking cessation and the health risks of continued tobacco abuse.  I explained to the patient that there has been a high incidence of coronary artery disease noted on these exams. I explained that this is a non-gated exam therefore degree or severity cannot be determined. This patient is on statin therapy. I have asked the patient to follow-up with their PCP regarding any incidental finding of coronary artery disease and management with diet or medication as their PCP  feels is clinically indicated. The patient verbalized understanding of the above and had no further questions upon completion of the visit.    I sent patient a text link to sign up for MyChart through [facility]  Bevelyn Ngo, NP 08/11/2022

## 2022-08-11 NOTE — Patient Instructions (Signed)
Thank you for participating in the [facility] Scre[facility]. It was our pleasure to meet you today. We will call you with the results of your scan within the next few days. Your scan will be assigned a Lung RADS category score by the physicians reading the scans.  This Lung RADS score determines follow up scanning.  See below for description of categories, and follow up screening recommendations. We will be in touch to schedule your follow up screening annually or based on recommendations of our providers. We will fax a copy of your scan results to your Primary Care Physician, or the physician who referred you to the program, to ensure they have the results. Please call the office if you have any questions or concerns regarding your scanning experience or results.  Our office number is [phone]. Please speak with Denise Phelps, RN. , or  Denise Buckner RN, They are  our Lung Cancer Screening RN.'s If They are unavailable when you call, Please leave a message on the voice mail. We will return your call at our earliest convenience.This voice mail is monitored several times a day.  Remember, if your scan is normal, we will scan you annually as long as you continue to meet the criteria for the program. (Age 50-80, Current smoker or smoker who has quit within the last 15 years). If you are a smoker, remember, quitting is the single most powerful action that you can take to decrease your risk of lung cancer and other pulmonary, breathing related problems. We know quitting is hard, and we are here to help.  Please let us know if there is anything we can do to help you meet your goal of quitting. If you are a former smoker, congratulations. We are proud of you! Remain smoke free! Remember you can refer friends or family members through the number above.  We will screen them to make sure they meet criteria for the program. Thank you for helping us take better care of you by  participating in Lung Screening.  You can receive free nicotine replacement therapy ( patches, gum or mints) by calling 1-800-QUIT NOW. Please call so we can get you on the path to becoming  a non-smoker. I know it is hard, but you can do this!  Lung RADS Categories:  Lung RADS 1: no nodules or definitely non-concerning nodules.  Recommendation is for a repeat annual scan in 12 months.  Lung RADS 2:  nodules that are non-concerning in appearance and behavior with a very low likelihood of becoming an active cancer. Recommendation is for a repeat annual scan in 12 months.  Lung RADS 3: nodules that are probably non-concerning , includes nodules with a low likelihood of becoming an active cancer.  Recommendation is for a 6-month repeat screening scan. Often noted after an upper respiratory illness. We will be in touch to make sure you have no questions, and to schedule your 6-month scan.  Lung RADS 4 A: nodules with concerning findings, recommendation is most often for a follow up scan in 3 months or additional testing based on our provider's assessment of the scan. We will be in touch to make sure you have no questions and to schedule the recommended 3 month follow up scan.  Lung RADS 4 B:  indicates findings that are concerning. We will be in touch with you to schedule additional diagnostic testing based on our provider's  assessment of the scan.  Other options for assistance in smoking cessation (   As covered by your insurance benefits)  Hypnosis for smoking cessation  [employer] [phone]  Acupuncture for smoking cessation  [facility] [phone]   

## 2022-08-26 ENCOUNTER — Ambulatory Visit: Payer: BC Managed Care – PPO

## 2022-08-26 ENCOUNTER — Ambulatory Visit
Admission: RE | Admit: 2022-08-26 | Discharge: 2022-08-26 | Disposition: A | Discharge status: Home / Self Care | Payer: BC Managed Care – PPO | Source: Ambulatory Visit | Attending: Acute Care | Admitting: Acute Care

## 2022-08-26 DIAGNOSIS — Z87891 Personal history of nicotine dependence: Secondary | ICD-10-CM

## 2022-08-26 DIAGNOSIS — Z122 Encounter for screening for malignant neoplasm of respiratory organs: Secondary | ICD-10-CM

## 2022-09-01 ENCOUNTER — Ambulatory Visit
Admission: RE | Admit: 2022-09-01 | Discharge: 2022-09-01 | Disposition: A | Discharge status: Home / Self Care | Payer: BC Managed Care – PPO | Source: Ambulatory Visit | Attending: Family Medicine | Admitting: Family Medicine

## 2022-09-01 DIAGNOSIS — Z1231 Encounter for screening mammogram for malignant neoplasm of breast: Secondary | ICD-10-CM | POA: Diagnosis present

## 2022-09-02 ENCOUNTER — Other Ambulatory Visit: Payer: Self-pay | Admitting: Acute Care

## 2022-09-02 DIAGNOSIS — Z122 Encounter for screening for malignant neoplasm of respiratory organs: Secondary | ICD-10-CM

## 2022-09-02 DIAGNOSIS — Z87891 Personal history of nicotine dependence: Secondary | ICD-10-CM

## 2022-09-02 DIAGNOSIS — F17210 Nicotine dependence, cigarettes, uncomplicated: Secondary | ICD-10-CM

## 2022-10-27 ENCOUNTER — Encounter: Payer: Self-pay | Admitting: Student in an Organized Health Care Education/Training Program

## 2022-10-27 ENCOUNTER — Ambulatory Visit: Payer: BC Managed Care – PPO | Admitting: Student in an Organized Health Care Education/Training Program

## 2022-10-27 VITALS — BP 114/80 | HR 74 | Temp 98.1°F | Ht 61.0 in | Wt 185.0 lb

## 2022-10-27 DIAGNOSIS — R0602 Shortness of breath: Secondary | ICD-10-CM

## 2022-10-27 DIAGNOSIS — F17210 Nicotine dependence, cigarettes, uncomplicated: Secondary | ICD-10-CM | POA: Diagnosis not present

## 2022-10-27 MED ORDER — TRELEGY ELLIPTA 100-62.5-25 MCG/ACT IN AEPB: 1.0000 | INHALATION_SPRAY | Freq: Once | RESPIRATORY_TRACT | 6 refills | Status: AC

## 2022-10-27 MED ORDER — NICOTINE 7 MG/24HR TD PT24: 7.0000 mg | MEDICATED_PATCH | TRANSDERMAL | 0 refills | Status: AC

## 2022-10-27 MED ORDER — NICOTINE 14 MG/24HR TD PT24: 14.0000 mg | MEDICATED_PATCH | TRANSDERMAL | 0 refills | Status: AC

## 2022-10-27 MED ORDER — NICOTINE POLACRILEX 2 MG MT LOZG: 2.0000 mg | LOZENGE | OROMUCOSAL | 3 refills | Status: AC | PRN

## 2022-10-27 MED ORDER — NICOTINE 21 MG/24HR TD PT24: 21.0000 mg | MEDICATED_PATCH | TRANSDERMAL | 0 refills | Status: AC

## 2022-10-27 NOTE — Progress Notes (Deleted)
Synopsis: Referred in *** by Alm Bustard, NP  Assessment & Plan:   There are no diagnoses linked to this encounter.  No follow-ups on file.  I spent *** minutes caring for this patient today, including {EM billing:[id number]}  Raechel Chute, MD [facility] 10/27/2022 1:53 PM    End of visit medications:  No orders of the defined types were placed in this encounter.    Current Outpatient Medications:    albuterol (VENTOLIN HFA) 108 (90 Base) MCG/ACT inhaler, 2 puffs every 6 (six) hours as needed., Disp: , Rfl:    atorvastatin (LIPITOR) 40 MG tablet, Take 1 tablet (40 mg total) by mouth daily., Disp: 30 tablet, Rfl: 11   fexofenadine-pseudoephedrine (ALLEGRA-D) 60-120 MG 12 hr tablet, Take 1 tablet by mouth 2 (two) times daily., Disp: 20 tablet, Rfl: 0   HYDROcodone-acetaminophen (NORCO/VICODIN) 5-325 MG tablet, Take 1 tablet by mouth every 6 (six) hours as needed for moderate pain., Disp: 12 tablet, Rfl: 0   lisinopril (PRINIVIL,ZESTRIL) 10 MG tablet, Take 1 tablet (10 mg total) by mouth daily., Disp: 30 tablet, Rfl: 0   umeclidinium-vilanterol (ANORO ELLIPTA) 62.5-25 MCG/ACT AEPB, Inhale 1 puff into the lungs daily., Disp: 30 each, Rfl: 11   Subjective:   PATIENT ID: Jasmine Roach GENDER: female DOB: 22-Aug-1965, MRN: [id number]  No chief complaint on file.   HPI ***  Ancillary information including prior medications, full medical/surgical/family/social histories, and PFTs (when available) are listed below and have been reviewed.   ROS   Objective:  There were no vitals filed for this visit.   on *** LPM *** RA BMI Readings from Last 3 Encounters:  03/17/22 34.77 kg/m  11/14/18 35.71 kg/m  04/02/18 33.63 kg/m   Wt Readings from Last 3 Encounters:  03/17/22 184 lb (83.5 kg)  11/14/18 189 lb (85.7 kg)  04/02/18 178 lb (80.7 kg)    Physical Exam    Ancillary Information    Past Medical History:  Diagnosis Date   GERD  (gastroesophageal reflux disease)    Hepatitis C    Hypertension      Family History  Problem Relation Age of Onset   Broken bones Mother        Hip   Cancer Father        Colon   Hypertension Father    Hyperlipidemia Father    Thyroid disease Brother    Thyroid disease Son    Breast cancer Other      Past Surgical History:  Procedure Laterality Date   CESAREAN SECTION     COLONOSCOPY WITH PROPOFOL N/A 11/14/2018   Procedure: COLONOSCOPY WITH PROPOFOL;  Surgeon: Midge Minium, MD;  Location: [facility] ENDOSCOPY;  Service: Endoscopy;  Laterality: N/A;    Social History   Socioeconomic History   Marital status: Single    Spouse name: Not on file   Number of children: Not on file   Years of education: Not on file   Highest education level: Not on file  Occupational History   Not on file  Tobacco Use   Smoking status: Every Day    Current packs/day: 1.00    Average packs/day: 1 pack/day for 44.0 years (44.0 ttl pk-yrs)    Types: Cigarettes   Smokeless tobacco: Never  Substance and Sexual Activity   Alcohol use: Yes    Alcohol/week: 1.0 standard drink of alcohol    Types: 1 Cans of beer per week    Comment: One beer once a week.  Drug use: No   Sexual activity: Not on file  Other Topics Concern   Not on file  Social History Narrative   Not on file   Social Determinants of Health   Financial Resource Strain: Low Risk  (09/16/2022)   Received from [facility]   Overall Financial Resource Strain (CARDIA)    Difficulty of Paying Living Expenses: Not hard at all  Food Insecurity: No Food Insecurity (09/16/2022)   Received from [facility]   Hunger Vital Sign    Worried About Running Out of Food in the Last Year: Never true    Ran Out of Food in the Last Year: Never true  Transportation Needs: No Transportation Needs (09/16/2022)   [facility] - Transportation    In the past 12 months, has lack  of transportation kept you from medical appointments or from getting medications?: No    Lack of Transportation (Non-Medical): No  Physical Activity: Not on file  Stress: Not on file  Social Connections: Not on file  Intimate Partner Violence: Not on file     No Known Allergies   CBC    Component Value Date/Time   WBC 8.3 04/02/2018 1336   RBC 4.78 04/02/2018 1336   HGB 12.6 04/02/2018 1336   HCT 39.9 04/02/2018 1336   PLT 315 04/02/2018 1336   MCV 83.5 04/02/2018 1336   MCH 26.4 04/02/2018 1336   MCHC 31.6 04/02/2018 1336   RDW 13.3 04/02/2018 1336    Pulmonary Functions Testing Results:     No data to display          Outpatient Medications Prior to Visit  Medication Sig Dispense Refill   albuterol (VENTOLIN HFA) 108 (90 Base) MCG/ACT inhaler 2 puffs every 6 (six) hours as needed.     atorvastatin (LIPITOR) 40 MG tablet Take 1 tablet (40 mg total) by mouth daily. 30 tablet 11   fexofenadine-pseudoephedrine (ALLEGRA-D) 60-120 MG 12 hr tablet Take 1 tablet by mouth 2 (two) times daily. 20 tablet 0   HYDROcodone-acetaminophen (NORCO/VICODIN) 5-325 MG tablet Take 1 tablet by mouth every 6 (six) hours as needed for moderate pain. 12 tablet 0   lisinopril (PRINIVIL,ZESTRIL) 10 MG tablet Take 1 tablet (10 mg total) by mouth daily. 30 tablet 0   umeclidinium-vilanterol (ANORO ELLIPTA) 62.5-25 MCG/ACT AEPB Inhale 1 puff into the lungs daily. 30 each 11   No facility-administered medications prior to visit.

## 2022-10-27 NOTE — Patient Instructions (Signed)
The [facility] Quitline: Call 1-800-QUIT-NOW (404-007-7167). The [place] Quitline is a free service for [employer]. Trained counselors are available from 8 am until 3 am, 365 days per year. Services are available in both [place] and [place].   Web Resources Free online support programs can help you track your progress and share experiences with others who are quitting. These are examples: www.becomeanex.org www.trytostop.org  www.smokefree.gov  www.https://www.vargas.com/.aspx  [facility]: offers comprehensive in-person tobacco treatment counseling at [facility] building ([address], [place] [address]).  Open to everyone. Virtual appointments available. Free parking. Call [phone] to schedule an appointment or [phone] for general information.    Tobacco Cessation Medications  Nicotine Replacement Therapy (NRT)  Nicotine is the addictive part of tobacco smoke, but not the most dangerous part. There are 7000 other toxins in cigarettes, including carbon monoxide, that cause disease. People do not generally become addicted to medication. Common problems: People don't use enough medication or stop too early. Medications are safe and effective. Overdose is very uncommon. Use medications as long as needed (3 months minimum). Some combinations work better than single medications. Long acting medications like the NRT patch and bupropion provide continuous treatment for withdrawal symptoms.  PLUS  Short acting medications like the NRT gum, lozenge, inhaler, and nasal spray help people to cope with breakthrough cravings.  ? Nicotine Patch  Place patch on hairless skin on upper body, including arms and back. Each day: discard old patch, shower, apply new patch to a different site. Apply hydrocortisone cream to mildly red/irritated areas. Call provider if rash develops. If patch causes sleep disturbance, remove patch  at bedtime and replace each morning after shower. Side effects may include: skin irritation, headache, insomnia, abnormal/vivid dreams.  ? Nicotine Gum  Chew gum slowly, park in cheek when peppery taste or tingling sensation begins (about 15-30 chews). When taste or tingling goes away, begin chewing again. Use until nicotine is gone (taste or tingle does not return, usually 30 minutes). Park in different areas of mouth. Nicotine is absorbed through the lining of the mouth. Use enough to control cravings, up to 24 pieces per day (if used alone). Avoid eating or drinking for 15 minutes before using and during use. Side effects may include: mouth/jaw soreness, hiccups, indigestion, hypersalivation.  If gum is not chewed correctly, additional side effects may include lightheadedness, nausea/vomiting, throat and mouth irritation.  ? Nicotine Lozenge  Allow to dissolve slowly in mouth (20-30 minutes). Do not chew or swallow. Nicotine release may cause a warm tingling sensation. Occasionally rotate to different areas of the mouth. Use enough to control cravings, up to 20 lozenges per day (if used alone). Avoid eating or drinking for 15 minutes before using and during use. Side effects may include: nausea, hiccups, cough, heartburn, headache, gas, insomnia.  ? Nicotine Nasal Spray Use 1 spray in each nostril (1 dose) and tilt head back for 1 minute. Do not sniff, swallow, or inhale through nose.  Use at least 8 doses (1 spray in each nostril) , up to 40 doses per day (if used alone). To reduce nasal irritation, spray on cotton swab and insert into nose. Side effects may include: nasal and/or throat irritation (hot, peppery, or burning sensation), nasal irritation, tearing, sneezing, cough, headache.  ? Nicotine Oral Inhaler (puffer) Inhale into the back of the throat or puff in short breaths. Do not inhale into the lungs.  Puff continuously for 20 minutes (about 80 puffs) until cartridge  is  empty. Change cartridge when it loses the "burning in throat" sensation (feels like air only). Open cartridges can be saved and used again within 24 hours. Use at least 6 and up to 16 cartridges per day (if used alone).  Avoid eating or drinking for 15 minutes before using and during use. Side effects may include: mouth and/or throat irritation, unpleasant taste, cough, nasal irritation, indigestion, hiccups, headache.  ? Chantix (varenicline) Days 1-3: Take one 0.5 mg white pill each morning for 3 days, one week before quit date. Days 4-7: Increase to one 0.5 mg white pill twice a day in morning and evening for 4 days.  On Day 8 (target quit date), increase to one 1 mg blue pill twice a day. Maintain this dose for a minimum of 3 months. Take with food and a full glass of water to reduce nausea. Be sure that the two doses are at least 8 hours apart, but try to take second dose early in the evening (i.e. 6 pm) to avoid sleep problems. Common side effects include: nausea, insomnia, headache, abnormal/vivid dreams. Tell your doctor if you have any history of psychiatric illness prior to starting Chantix.  STOP taking CHANTIX and contact a healthcare provider immediately if you experience agitation, hostility, depressed mood, changes in thoughts or behavior that are not typical for you, thinking about or attempting suicide, allergic or skin reactions including swelling, rash, redness, or peeling of the skin.  For patients who have heart disease: Smoking is a major risk factor for cardiovascular disease, and Chantix can help you quit smoking. Chantix may be associated with a small, increased risk of certain heart events in patients who have heart disease. If you have any new or worsening symptoms of heart disease while taking Chantix, such as shortness of breath or trouble breathing, new or worsening chest pain, or new or worsening pain in your legs when walking, call your doctor or get emergency medical  help immediately.  ? Wellbutrin / Zyban (bupropion) Take one 150 mg pill each morning for 3 days, one week before target quit date. On Day 4, increase to one 150 mg pill twice a day, morning and evening.  Maintain this dose for a minimum of 3 months. Be sure that the two doses are at least 8 hours apart, but try to take second dose early in the evening (i.e. 6 pm) to avoid sleep problems. Avoid or minimize use of alcohol when taking this medication. Common side effects include: dry mouth, headache, insomnia, nausea, weight loss.  Risk of seizure is 02/998. STOP taking BUPROPION and contact a healthcare provider immediately if you experience agitation, hostility, depressed mood, changes in thoughts or behavior that are not typical for you, thinking about or attempting suicide, allergic or skin reactions including swelling, rash, redness, or peeling of the skin.

## 2022-10-27 NOTE — Progress Notes (Signed)
Assessment & Plan:   #Shortness of breath #Presumed COPD  Presenting for the evaluation of shortness of breath in the setting of long standing smoking history. We did start LAMA/LABA with Anoro Ellipta during her prior visit with minimal improvement in symptoms. Today, I will broaden management to include triple therapy with ICS/LABA/LAMA. Patient encouraged to schedule and maintain PFT appointment.   - PFT's (Spirometry, lung volumes, DLCO) - Fluticasone-Umeclidin-Vilant (TRELEGY ELLIPTA) 100-62.5-25 MCG/ACT AEPB; Inhale 1 puff into the lungs once for 1 dose.  Dispense: 1 each; Refill: 6  #Cigarette smoker  Smoking cessation highly encouraged  - nicotine (NICODERM CQ - DOSED IN MG/24 HOURS) 14 mg/24hr patch; Place 1 patch (14 mg total) onto the skin daily for 14 days.  Dispense: 14 patch; Refill: 0 - nicotine (NICODERM CQ - DOSED IN MG/24 HOURS) 21 mg/24hr patch; Place 1 patch (21 mg total) onto the skin daily.  Dispense: 42 patch; Refill: 0 - nicotine (NICODERM CQ - DOSED IN MG/24 HR) 7 mg/24hr patch; Place 1 patch (7 mg total) onto the skin daily for 14 days.  Dispense: 14 patch; Refill: 0 - nicotine polacrilex (NICOTINE MINI) 2 MG lozenge; Take 1 lozenge (2 mg total) by mouth every 2 (two) hours as needed for smoking cessation.  Dispense: 72 lozenge; Refill: 3   Return in about 6 months (around 04/26/2023).  I spent 30 minutes caring for this patient today, including preparing to see the patient, obtaining a medical history , reviewing a separately obtained history, performing a medically appropriate examination and/or evaluation, counseling and educating the patient/family/caregiver, ordering medications, tests, or procedures, documenting clinical information in the electronic health record, and independently interpreting results (not separately reported/billed) and communicating results to the patient/family/caregiver  Raechel Chute, MD [facility] 10/27/2022  2:20 PM    End of visit medications:  Meds ordered this encounter  Medications   nicotine (NICODERM CQ - DOSED IN MG/24 HOURS) 14 mg/24hr patch    Sig: Place 1 patch (14 mg total) onto the skin daily for 14 days.    Dispense:  14 patch    Refill:  0   nicotine (NICODERM CQ - DOSED IN MG/24 HOURS) 21 mg/24hr patch    Sig: Place 1 patch (21 mg total) onto the skin daily.    Dispense:  42 patch    Refill:  0   nicotine (NICODERM CQ - DOSED IN MG/24 HR) 7 mg/24hr patch    Sig: Place 1 patch (7 mg total) onto the skin daily for 14 days.    Dispense:  14 patch    Refill:  0   nicotine polacrilex (NICOTINE MINI) 2 MG lozenge    Sig: Take 1 lozenge (2 mg total) by mouth every 2 (two) hours as needed for smoking cessation.    Dispense:  72 lozenge    Refill:  3   Fluticasone-Umeclidin-Vilant (TRELEGY ELLIPTA) 100-62.5-25 MCG/ACT AEPB    Sig: Inhale 1 puff into the lungs once for 1 dose.    Dispense:  1 each    Refill:  6     Current Outpatient Medications:    albuterol (VENTOLIN HFA) 108 (90 Base) MCG/ACT inhaler, 2 puffs every 6 (six) hours as needed., Disp: , Rfl:    fexofenadine-pseudoephedrine (ALLEGRA-D) 60-120 MG 12 hr tablet, Take 1 tablet by mouth 2 (two) times daily., Disp: 20 tablet, Rfl: 0   Fluticasone-Umeclidin-Vilant (TRELEGY ELLIPTA) 100-62.5-25 MCG/ACT AEPB, Inhale 1 puff into the lungs once for 1 dose., Disp: 1  each, Rfl: 6   HYDROcodone-acetaminophen (NORCO/VICODIN) 5-325 MG tablet, Take 1 tablet by mouth every 6 (six) hours as needed for moderate pain., Disp: 12 tablet, Rfl: 0   nicotine (NICODERM CQ - DOSED IN MG/24 HOURS) 14 mg/24hr patch, Place 1 patch (14 mg total) onto the skin daily for 14 days., Disp: 14 patch, Rfl: 0   nicotine (NICODERM CQ - DOSED IN MG/24 HOURS) 21 mg/24hr patch, Place 1 patch (21 mg total) onto the skin daily., Disp: 42 patch, Rfl: 0   nicotine (NICODERM CQ - DOSED IN MG/24 HR) 7 mg/24hr patch, Place 1 patch (7 mg total) onto the skin daily  for 14 days., Disp: 14 patch, Rfl: 0   nicotine polacrilex (NICOTINE MINI) 2 MG lozenge, Take 1 lozenge (2 mg total) by mouth every 2 (two) hours as needed for smoking cessation., Disp: 72 lozenge, Rfl: 3   umeclidinium-vilanterol (ANORO ELLIPTA) 62.5-25 MCG/ACT AEPB, Inhale 1 puff into the lungs daily., Disp: 30 each, Rfl: 11   atorvastatin (LIPITOR) 40 MG tablet, Take 1 tablet (40 mg total) by mouth daily., Disp: 30 tablet, Rfl: 11   lisinopril (PRINIVIL,ZESTRIL) 10 MG tablet, Take 1 tablet (10 mg total) by mouth daily., Disp: 30 tablet, Rfl: 0   Subjective:   PATIENT ID: Jasmine Roach GENDER: female DOB: 1965-03-09, MRN: [id number]  Chief Complaint  Patient presents with   Follow-up    DOE. Wheezing. Dry cough.     HPI  57 year old female presenting to clinic for follow up of shortness of breath.  Patient reports continued symptoms. She continues to smoke. She reports using her inhaler as prescribed most of the time, with some improvement. She continues to be short of breath with exertion as well as experience a cough. She unfortunately continues to smoke a pack a day. She hasn't had her PFT's. She did undergo the LDCT for lung cancer screening and the next CT is scheduled in 1 year.   She reports shortness of breath with exertion, especially going up stairs and walking long distances.  She reports a cough that is chronic, throughout the day, and sometimes productive of clear sputum.  She denies any hemoptysis.  She denies any fevers, chills, night sweats, or weight loss.  Patient does not have any tightness and does not report wheezing.    Patient works at [employer] and has no limitations at work secondary to her shortness of breath.  She is a smoker (at least 44 pack years) and is contemplating quitting.  She currently has nicotine patches to aid in smoking cessation.    Ancillary information including prior medications, full medical/surgical/family/social histories, and PFTs (when  available) are listed below and have been reviewed.   Review of Systems  Constitutional:  Negative for chills, fever, malaise/fatigue and weight loss.  Respiratory:  Positive for cough and shortness of breath. Negative for hemoptysis, sputum production and wheezing.   Cardiovascular:  Negative for chest pain.  Skin:  Negative for rash.     Objective:   Vitals:   10/27/22 1357  BP: 114/80  Pulse: 74  Temp: 98.1 F (36.7 C)  SpO2: 95%  Weight: 185 lb (83.9 kg)  Height: 5\' 1"  (1.549 m)   95% on RA  BMI Readings from Last 3 Encounters:  10/27/22 34.96 kg/m  03/17/22 34.77 kg/m  11/14/18 35.71 kg/m   Wt Readings from Last 3 Encounters:  10/27/22 185 lb (83.9 kg)  03/17/22 184 lb (83.5 kg)  11/14/18 189 lb (85.7 kg)  Physical Exam Constitutional:      Appearance: She is obese. She is not ill-appearing.  HENT:     Head: Normocephalic.     Mouth/Throat:     Mouth: Mucous membranes are moist.  Cardiovascular:     Rate and Rhythm: Normal rate and regular rhythm.     Pulses: Normal pulses.     Heart sounds: Normal heart sounds.  Pulmonary:     Effort: Pulmonary effort is normal.     Breath sounds: Normal breath sounds. No wheezing or rales.  Abdominal:     General: There is distension.  Neurological:     General: No focal deficit present.     Mental Status: She is alert and oriented to person, place, and time. Mental status is at baseline.       Ancillary Information    Past Medical History:  Diagnosis Date   GERD (gastroesophageal reflux disease)    Hepatitis C    Hypertension      Family History  Problem Relation Age of Onset   Broken bones Mother        Hip   Cancer Father        Colon   Hypertension Father    Hyperlipidemia Father    Thyroid disease Brother    Thyroid disease Son    Breast cancer Other      Past Surgical History:  Procedure Laterality Date   CESAREAN SECTION     COLONOSCOPY WITH PROPOFOL N/A 11/14/2018   Procedure:  COLONOSCOPY WITH PROPOFOL;  Surgeon: Midge Minium, MD;  Location: [facility] ENDOSCOPY;  Service: Endoscopy;  Laterality: N/A;    Social History   Socioeconomic History   Marital status: Single    Spouse name: Not on file   Number of children: Not on file   Years of education: Not on file   Highest education level: Not on file  Occupational History   Not on file  Tobacco Use   Smoking status: Every Day    Current packs/day: 1.00    Average packs/day: 1 pack/day for 44.0 years (44.0 ttl pk-yrs)    Types: Cigarettes   Smokeless tobacco: Never   Tobacco comments:    1 PPD- khj 10/27/2022  Substance and Sexual Activity   Alcohol use: Yes    Alcohol/week: 1.0 standard drink of alcohol    Types: 1 Cans of beer per week    Comment: One beer once a week.    Drug use: No   Sexual activity: Not on file  Other Topics Concern   Not on file  Social History Narrative   Not on file   Social Determinants of Health   Financial Resource Strain: Low Risk  (09/16/2022)   [facility]   Overall Financial Resource Strain (CARDIA)    Difficulty of Paying Living Expenses: Not hard at all  Food Insecurity: No Food Insecurity (09/16/2022)   Received from [facility]   Hunger Vital Sign    Worried About Running Out of Food in the Last Year: Never true    Ran Out of Food in the Last Year: Never true  Transportation Needs: No Transportation Needs (09/16/2022)   Received from [facility] - [facility]    In the past 12 months, has lack of transportation kept you from medical appointments or from getting medications?: No    Lack of Transportation (Non-Medical): No  Physical Activity: Not on file  Stress: Not on  file  Social Connections: Not on file  Intimate Partner Violence: Not on file     No Known Allergies   CBC    Component Value Date/Time   WBC 8.3 04/02/2018 1336   RBC 4.78 04/02/2018 1336   HGB 12.6 04/02/2018  1336   HCT 39.9 04/02/2018 1336   PLT 315 04/02/2018 1336   MCV 83.5 04/02/2018 1336   MCH 26.4 04/02/2018 1336   MCHC 31.6 04/02/2018 1336   RDW 13.3 04/02/2018 1336    Pulmonary Functions Testing Results:     No data to display          Outpatient Medications Prior to Visit  Medication Sig Dispense Refill   albuterol (VENTOLIN HFA) 108 (90 Base) MCG/ACT inhaler 2 puffs every 6 (six) hours as needed.     fexofenadine-pseudoephedrine (ALLEGRA-D) 60-120 MG 12 hr tablet Take 1 tablet by mouth 2 (two) times daily. 20 tablet 0   HYDROcodone-acetaminophen (NORCO/VICODIN) 5-325 MG tablet Take 1 tablet by mouth every 6 (six) hours as needed for moderate pain. 12 tablet 0   umeclidinium-vilanterol (ANORO ELLIPTA) 62.5-25 MCG/ACT AEPB Inhale 1 puff into the lungs daily. 30 each 11   atorvastatin (LIPITOR) 40 MG tablet Take 1 tablet (40 mg total) by mouth daily. 30 tablet 11   lisinopril (PRINIVIL,ZESTRIL) 10 MG tablet Take 1 tablet (10 mg total) by mouth daily. 30 tablet 0   No facility-administered medications prior to visit.

## 2022-10-28 ENCOUNTER — Telehealth: Payer: Self-pay | Admitting: Student in an Organized Health Care Education/Training Program

## 2022-10-28 NOTE — Telephone Encounter (Signed)
Called and spoke to patient.  She stated that Trelegy is not affordable with a 130 dollar co pay.   Pharmacy team, can you run a test claim for alternatives.

## 2022-10-28 NOTE — Telephone Encounter (Signed)
PT left message on after hours call center that her inhaler is too expensive. I believe she means the Anoro. Please call to advise options. Thanks.

## 2022-11-02 NOTE — Telephone Encounter (Signed)
PA team, please advise. Thanks ?

## 2022-11-03 ENCOUNTER — Other Ambulatory Visit (HOSPITAL_COMMUNITY): Payer: Self-pay

## 2022-11-03 NOTE — Telephone Encounter (Signed)
Per pharmacy Trelegy is showing as $167.79/30 days at this time. We are unable to do test claims as patients insurance will only allow claims to be done at [facility]. Patient may have to contact plan to discuss alternatives.

## 2022-11-03 NOTE — Telephone Encounter (Signed)
Spoke to patient and relayed below message/recommendations. She will contact insurance for a list of alternatives and call back with update.  Nothing further needed at this time. Will await call back

## 2022-12-02 ENCOUNTER — Other Ambulatory Visit: Payer: Self-pay

## 2022-12-02 DIAGNOSIS — R0602 Shortness of breath: Secondary | ICD-10-CM

## 2023-08-17 ENCOUNTER — Other Ambulatory Visit: Payer: Self-pay | Admitting: Family Medicine

## 2023-08-17 DIAGNOSIS — Z1231 Encounter for screening mammogram for malignant neoplasm of breast: Secondary | ICD-10-CM

## 2023-08-30 ENCOUNTER — Other Ambulatory Visit: Payer: Self-pay | Admitting: Acute Care

## 2023-08-30 DIAGNOSIS — Z87891 Personal history of nicotine dependence: Secondary | ICD-10-CM

## 2023-08-30 DIAGNOSIS — F17210 Nicotine dependence, cigarettes, uncomplicated: Secondary | ICD-10-CM

## 2023-08-30 DIAGNOSIS — Z122 Encounter for screening for malignant neoplasm of respiratory organs: Secondary | ICD-10-CM

## 2023-08-31 ENCOUNTER — Inpatient Hospital Stay: Admission: RE | Admit: 2023-08-31 | Source: Ambulatory Visit

## 2023-09-07 ENCOUNTER — Ambulatory Visit
Admission: RE | Admit: 2023-09-07 | Discharge: 2023-09-07 | Disposition: A | Discharge status: Home / Self Care | Source: Ambulatory Visit | Attending: Family Medicine | Admitting: Family Medicine

## 2023-09-07 DIAGNOSIS — Z1231 Encounter for screening mammogram for malignant neoplasm of breast: Secondary | ICD-10-CM | POA: Diagnosis present

## 2023-09-15 ENCOUNTER — Inpatient Hospital Stay: Admission: RE | Admit: 2023-09-15 | Source: Ambulatory Visit

## 2023-09-28 ENCOUNTER — Inpatient Hospital Stay: Admission: RE | Admit: 2023-09-28 | Source: Ambulatory Visit

## 2023-11-03 ENCOUNTER — Inpatient Hospital Stay: Admission: RE | Admit: 2023-11-03 | Source: Ambulatory Visit

## 2024-03-22 ENCOUNTER — Other Ambulatory Visit
